# Patient Record
Sex: Male | Born: 1935 | Race: White | Hispanic: No | Marital: Married | Smoking: Former smoker
Health system: Southern US, Community
[De-identification: ages and names within clinical notes are randomized; demographics above are authoritative.]

## PROBLEM LIST (undated history)

## (undated) DIAGNOSIS — I10 Essential (primary) hypertension: Secondary | ICD-10-CM

## (undated) DIAGNOSIS — E7800 Pure hypercholesterolemia, unspecified: Secondary | ICD-10-CM

## (undated) DIAGNOSIS — Z85828 Personal history of other malignant neoplasm of skin: Secondary | ICD-10-CM

## (undated) DIAGNOSIS — N400 Enlarged prostate without lower urinary tract symptoms: Secondary | ICD-10-CM

## (undated) DIAGNOSIS — N189 Chronic kidney disease, unspecified: Secondary | ICD-10-CM

## (undated) DIAGNOSIS — J45909 Unspecified asthma, uncomplicated: Secondary | ICD-10-CM

## (undated) DIAGNOSIS — M109 Gout, unspecified: Secondary | ICD-10-CM

## (undated) HISTORY — PX: HERNIA REPAIR: SHX51

## (undated) HISTORY — DX: Personal history of other malignant neoplasm of skin: Z85.828

## (undated) HISTORY — DX: Chronic kidney disease, unspecified: N18.9

## (undated) HISTORY — DX: Benign prostatic hyperplasia without lower urinary tract symptoms: N40.0

## (undated) HISTORY — PX: CATARACT EXTRACTION, BILATERAL: SHX1313

---

## 2012-04-05 ENCOUNTER — Other Ambulatory Visit: Payer: Self-pay | Admitting: Internal Medicine

## 2012-04-05 DIAGNOSIS — N3090 Cystitis, unspecified without hematuria: Secondary | ICD-10-CM

## 2012-04-12 ENCOUNTER — Ambulatory Visit
Admission: RE | Admit: 2012-04-12 | Discharge: 2012-04-12 | Disposition: A | Discharge status: Home / Self Care | Payer: Medicare Other | Source: Ambulatory Visit | Attending: Internal Medicine | Admitting: Internal Medicine

## 2012-04-12 DIAGNOSIS — N3090 Cystitis, unspecified without hematuria: Secondary | ICD-10-CM

## 2013-10-05 ENCOUNTER — Emergency Department (HOSPITAL_COMMUNITY)
Admission: EM | Admit: 2013-10-05 | Discharge: 2013-10-05 | Disposition: A | Discharge status: Home / Self Care | Payer: Medicare Other | Attending: Emergency Medicine | Admitting: Emergency Medicine

## 2013-10-05 ENCOUNTER — Emergency Department (HOSPITAL_COMMUNITY): Payer: Medicare Other

## 2013-10-05 ENCOUNTER — Encounter (HOSPITAL_COMMUNITY): Payer: Self-pay | Admitting: Emergency Medicine

## 2013-10-05 DIAGNOSIS — Z7982 Long term (current) use of aspirin: Secondary | ICD-10-CM | POA: Insufficient documentation

## 2013-10-05 DIAGNOSIS — N289 Disorder of kidney and ureter, unspecified: Secondary | ICD-10-CM | POA: Insufficient documentation

## 2013-10-05 DIAGNOSIS — Z79899 Other long term (current) drug therapy: Secondary | ICD-10-CM | POA: Insufficient documentation

## 2013-10-05 DIAGNOSIS — J40 Bronchitis, not specified as acute or chronic: Secondary | ICD-10-CM | POA: Insufficient documentation

## 2013-10-05 DIAGNOSIS — Z87891 Personal history of nicotine dependence: Secondary | ICD-10-CM | POA: Insufficient documentation

## 2013-10-05 DIAGNOSIS — I10 Essential (primary) hypertension: Secondary | ICD-10-CM | POA: Insufficient documentation

## 2013-10-05 LAB — COMPREHENSIVE METABOLIC PANEL
ALBUMIN: 4.4 g/dL (ref 3.5–5.2)
ALT: 25 U/L (ref 0–53)
AST: 24 U/L (ref 0–37)
Alkaline Phosphatase: 77 U/L (ref 39–117)
BUN: 31 mg/dL — AB (ref 6–23)
CALCIUM: 10.7 mg/dL — AB (ref 8.4–10.5)
CO2: 22 mEq/L (ref 19–32)
Chloride: 103 mEq/L (ref 96–112)
Creatinine, Ser: 1.73 mg/dL — ABNORMAL HIGH (ref 0.50–1.35)
GFR calc non Af Amer: 35 mL/min — ABNORMAL LOW (ref >90)
GFR, EST AFRICAN AMERICAN: 40 mL/min — AB (ref >90)
GLUCOSE: 100 mg/dL — AB (ref 70–99)
Potassium: 5.1 mEq/L (ref 3.7–5.3)
Sodium: 138 mEq/L (ref 137–147)
TOTAL PROTEIN: 8.8 g/dL — AB (ref 6.0–8.3)
Total Bilirubin: 0.5 mg/dL (ref 0.3–1.2)

## 2013-10-05 LAB — CBC WITH DIFFERENTIAL/PLATELET
BASOS PCT: 1 % (ref 0–1)
Basophils Absolute: 0.1 10*3/uL (ref 0.0–0.1)
EOS ABS: 0.3 10*3/uL (ref 0.0–0.7)
EOS PCT: 4 % (ref 0–5)
HCT: 44.8 % (ref 39.0–52.0)
HEMOGLOBIN: 15.4 g/dL (ref 13.0–17.0)
LYMPHS ABS: 3.2 10*3/uL (ref 0.7–4.0)
Lymphocytes Relative: 36 % (ref 12–46)
MCH: 30.7 pg (ref 26.0–34.0)
MCHC: 34.4 g/dL (ref 30.0–36.0)
MCV: 89.2 fL (ref 78.0–100.0)
MONOS PCT: 7 % (ref 3–12)
Monocytes Absolute: 0.6 10*3/uL (ref 0.1–1.0)
NEUTROS PCT: 52 % (ref 43–77)
Neutro Abs: 4.7 10*3/uL (ref 1.7–7.7)
PLATELETS: 264 10*3/uL (ref 150–400)
RBC: 5.02 MIL/uL (ref 4.22–5.81)
RDW: 13.7 % (ref 11.5–15.5)
WBC: 8.9 10*3/uL (ref 4.0–10.5)

## 2013-10-05 LAB — I-STAT TROPONIN, ED: Troponin i, poc: 0 ng/mL (ref 0.00–0.08)

## 2013-10-05 MED ORDER — PREDNISONE 20 MG PO TABS: ORAL_TABLET | ORAL | Status: DC

## 2013-10-05 MED ORDER — METHYLPREDNISOLONE SODIUM SUCC 125 MG IJ SOLR
125.0000 mg | Freq: Once | INTRAMUSCULAR | Status: AC
  Administered 2013-10-05: 125 mg via INTRAVENOUS
  Filled 2013-10-05: qty 2

## 2013-10-05 MED ORDER — ALBUTEROL SULFATE HFA 108 (90 BASE) MCG/ACT IN AERS
2.0000 | INHALATION_SPRAY | Freq: Once | RESPIRATORY_TRACT | Status: AC
  Administered 2013-10-05: 2 via RESPIRATORY_TRACT
  Filled 2013-10-05: qty 6.7

## 2013-10-05 MED ORDER — SODIUM CHLORIDE 0.9 % IV BOLUS (SEPSIS)
1000.0000 mL | Freq: Once | INTRAVENOUS | Status: AC
  Administered 2013-10-05: 1000 mL via INTRAVENOUS

## 2013-10-05 MED ORDER — MORPHINE SULFATE 4 MG/ML IJ SOLN
4.0000 mg | Freq: Once | INTRAMUSCULAR | Status: DC
  Filled 2013-10-05: qty 1

## 2013-10-05 MED ORDER — ALBUTEROL SULFATE (2.5 MG/3ML) 0.083% IN NEBU
5.0000 mg | INHALATION_SOLUTION | Freq: Once | RESPIRATORY_TRACT | Status: AC
  Administered 2013-10-05: 5 mg via RESPIRATORY_TRACT
  Filled 2013-10-05: qty 6

## 2013-10-05 MED ORDER — IPRATROPIUM-ALBUTEROL 0.5-2.5 (3) MG/3ML IN SOLN
3.0000 mL | Freq: Once | RESPIRATORY_TRACT | Status: AC
  Administered 2013-10-05: 3 mL via RESPIRATORY_TRACT
  Filled 2013-10-05: qty 3

## 2013-10-05 NOTE — ED Notes (Signed)
Pt reports left sided CP x 1 week, worse with cough. States productive cough with green sputum x 1 week. Seen by PCP 4/17 for same. Pt denies pain at this time. Denies SOB, N/V, diaphoresis. NAD.

## 2013-10-05 NOTE — Discharge Instructions (Signed)
Take prednisone as prescribed.   Use albuterol every 4 hrs for 2 days then as needed.   Follow up with your doctor in a week. Your oxygen is running low. You need recheck with your doctor. Your kidney function is slightly off, need recheck.   Return to ER if you have worse chest pain, congestion, shortness of breath.

## 2013-10-05 NOTE — ED Notes (Signed)
Patient ambulated in hallways- pt taking deep breaths SpO2 93-95% pt denies SOB, dizziness and lightheadedness. Pt instructed to breathe normally pt sats maintained over 90% for most of the time.

## 2013-10-05 NOTE — ED Notes (Signed)
Patient ambulated in hallways by Marylu LundJanet EMT without O2- pt denies SOB sts "I feel fine" O2 sats between 89-94%. Dr. Silverio LayYao made aware.

## 2013-10-05 NOTE — ED Provider Notes (Addendum)
CSN: [id number]     Arrival date & time 10/05/13  1116 History   First MD Initiated Contact with Patient 10/05/13 1129     Chief Complaint  Patient presents with  . Cough  . Chest Pain     (Consider location/radiation/quality/duration/timing/severity/associated sxs/prior Treatment) The history is provided by the patient.  Benjamin Sanders is a 80 y.o. male hx of HTN, here with cough, chest pain. Productive cough with greenish sputum for the last several days. Also intermittent substernal chest pain when he coughs. Denies fevers or chills. Denies shortness of breath. Went to see his doctor 5 days ago and was thought to have viral syndrome and was given pneumonia vaccine. He has been coughing more since then and came in for evaluation.    History reviewed. No pertinent past medical history. Past Surgical History  Procedure Laterality Date  . Hernia repair     History reviewed. No pertinent family history. History  Substance Use Topics  . Smoking status: Former Smoker    Types: Cigarettes    Quit date: 06/16/1967  . Smokeless tobacco: Not on file  . Alcohol Use: No    Review of Systems  Respiratory: Positive for cough.   Cardiovascular: Positive for chest pain.  All other systems reviewed and are negative.     Allergies  Review of patient's allergies indicates no known allergies.  Home Medications   Prior to Admission medications   Medication Sig Start Date End Date Taking? Authorizing Provider  acetaminophen (TYLENOL) 500 MG tablet Take 1,000 mg by mouth every 6 (six) hours as needed for mild pain.   Yes Historical Provider, MD  allopurinol (ZYLOPRIM) 100 MG tablet Take 100 mg by mouth daily.   Yes Historical Provider, MD  amLODipine (NORVASC) 10 MG tablet Take 10 mg by mouth daily.   Yes Historical Provider, MD  aspirin EC 81 MG tablet Take 81 mg by mouth daily.   Yes Historical Provider, MD  febuxostat (ULORIC) 40 MG tablet Take 40 mg by mouth daily.   Yes Historical  Provider, MD  hydrochlorothiazide (MICROZIDE) 12.5 MG capsule Take 12.5 mg by mouth daily.   Yes Historical Provider, MD  lisinopril (PRINIVIL,ZESTRIL) 20 MG tablet Take 20 mg by mouth daily.   Yes Historical Provider, MD  rosuvastatin (CRESTOR) 20 MG tablet Take 20 mg by mouth at bedtime.   Yes Historical Provider, MD   BP 125/56  Pulse 67  Temp(Src) 97.9 F (36.6 C) (Oral)  Resp 18  Ht 5\' 8"  (1.727 m)  SpO2 98% Physical Exam  Nursing note and vitals reviewed. Constitutional: He is oriented to person, place, and time.  Chronically ill, coughing   HENT:  Head: Normocephalic.  Mouth/Throat: Oropharynx is clear and moist.  Eyes: Conjunctivae and EOM are normal. Pupils are equal, round, and reactive to light.  Neck: Normal range of motion. Neck supple.  Cardiovascular: Normal rate, regular rhythm and normal heart sounds.   Pulmonary/Chest:  Coughing, diminished breath sounds throughout. No obvious crackles or wheezing. Mild reproducible substernal tenderness   Abdominal: Soft. Bowel sounds are normal. He exhibits no distension. There is no tenderness. There is no rebound and no guarding.  Musculoskeletal: Normal range of motion. He exhibits no edema and no tenderness.  Neurological: He is alert and oriented to person, place, and time.  Skin: Skin is warm and dry.  Psychiatric: He has a normal mood and affect. His behavior is normal. Judgment and thought content normal.    ED Course  Procedures (  including critical care time) Labs Review Labs Reviewed  COMPREHENSIVE METABOLIC PANEL - Abnormal; Notable for the following:    Glucose, Bld 100 (*)    BUN 31 (*)    Creatinine, Ser 1.73 (*)    Calcium 10.7 (*)    Total Protein 8.8 (*)    GFR calc non Af Amer 35 (*)    GFR calc Af Amer 40 (*)    All other components within normal limits  CBC WITH DIFFERENTIAL  Rosezena SensorI-STAT TROPOININ, ED    Imaging Review Dg Chest 2 View  10/05/2013   CLINICAL DATA:  Cough and wheezing.  EXAM: CHEST  2  VIEW  COMPARISON:  None.  FINDINGS: Cardiac silhouette is normal in size. Normal mediastinal and hilar contours.  There is linear opacity at the lung bases. This may be atelectasis or scarring or a combination. Lungs are otherwise clear. No pleural effusion. No pneumothorax.  Bony thorax is demineralized grossly intact.  IMPRESSION: No acute cardiopulmonary disease.   Electronically Signed   By: Amie Portlandavid  Ormond M.D.   On: 10/05/2013 12:28     EKG Interpretation   Date/Time:  Thursday October 05 2013 11:20:17 EDT Ventricular Rate:  78 PR Interval:  174 QRS Duration: 96 QT Interval:  360 QTC Calculation: 410 R Axis:   -53 Text Interpretation:  Normal sinus rhythm Left anterior fascicular block  Septal infarct , age undetermined Abnormal ECG No previous ECGs available  Confirmed by YAO  MD, DAVID ([id number]) on 10/05/2013 11:39:55 AM Also  confirmed by Silverio LayYAO  MD, DAVID ([id number])  on 10/05/2013 1:16:51 PM      MDM   Final diagnoses:  None    Benjamin Sanders is a 80 y.o. male here with cough, chest pain. Likely bronchitis vs pneumonia. I doubt acs or dissection or PE. Will get labs, CXR. Will give nebs empirically.   4:09 PM cxr showed no pneumonia. Was hypoxic on ambulation initially. After 3 nebs, solumedrol, no longer hypoxic on ambulation. Maintains O2 sat > 90%. Cr 1.7, no baseline. Given IVF. I doubt PE as his clinical picture consistent with bronchitis. Offered admission for observation, but patient declined.    Richardean Canalavid H Yao, MD 10/05/13 1610  Richardean Canalavid H Yao, MD 10/05/13 1610  Richardean Canalavid H Yao, MD 10/05/13 [phone]

## 2013-10-05 NOTE — ED Notes (Signed)
Dr. Yao at bedside. 

## 2013-10-05 NOTE — ED Notes (Signed)
Pt SpO2 between 88-89% on RA. RN instructed patient on deep breathing techniques for 1 min before SpO2 increased to appx. 98%.

## 2015-05-11 IMAGING — CR DG CHEST 2V
2 series · 2 of 2 positions shown · non-contrast
Comparison: None.

CLINICAL DATA: Cough and wheezing.

EXAM:
CHEST  2 VIEW

[w chest pa]
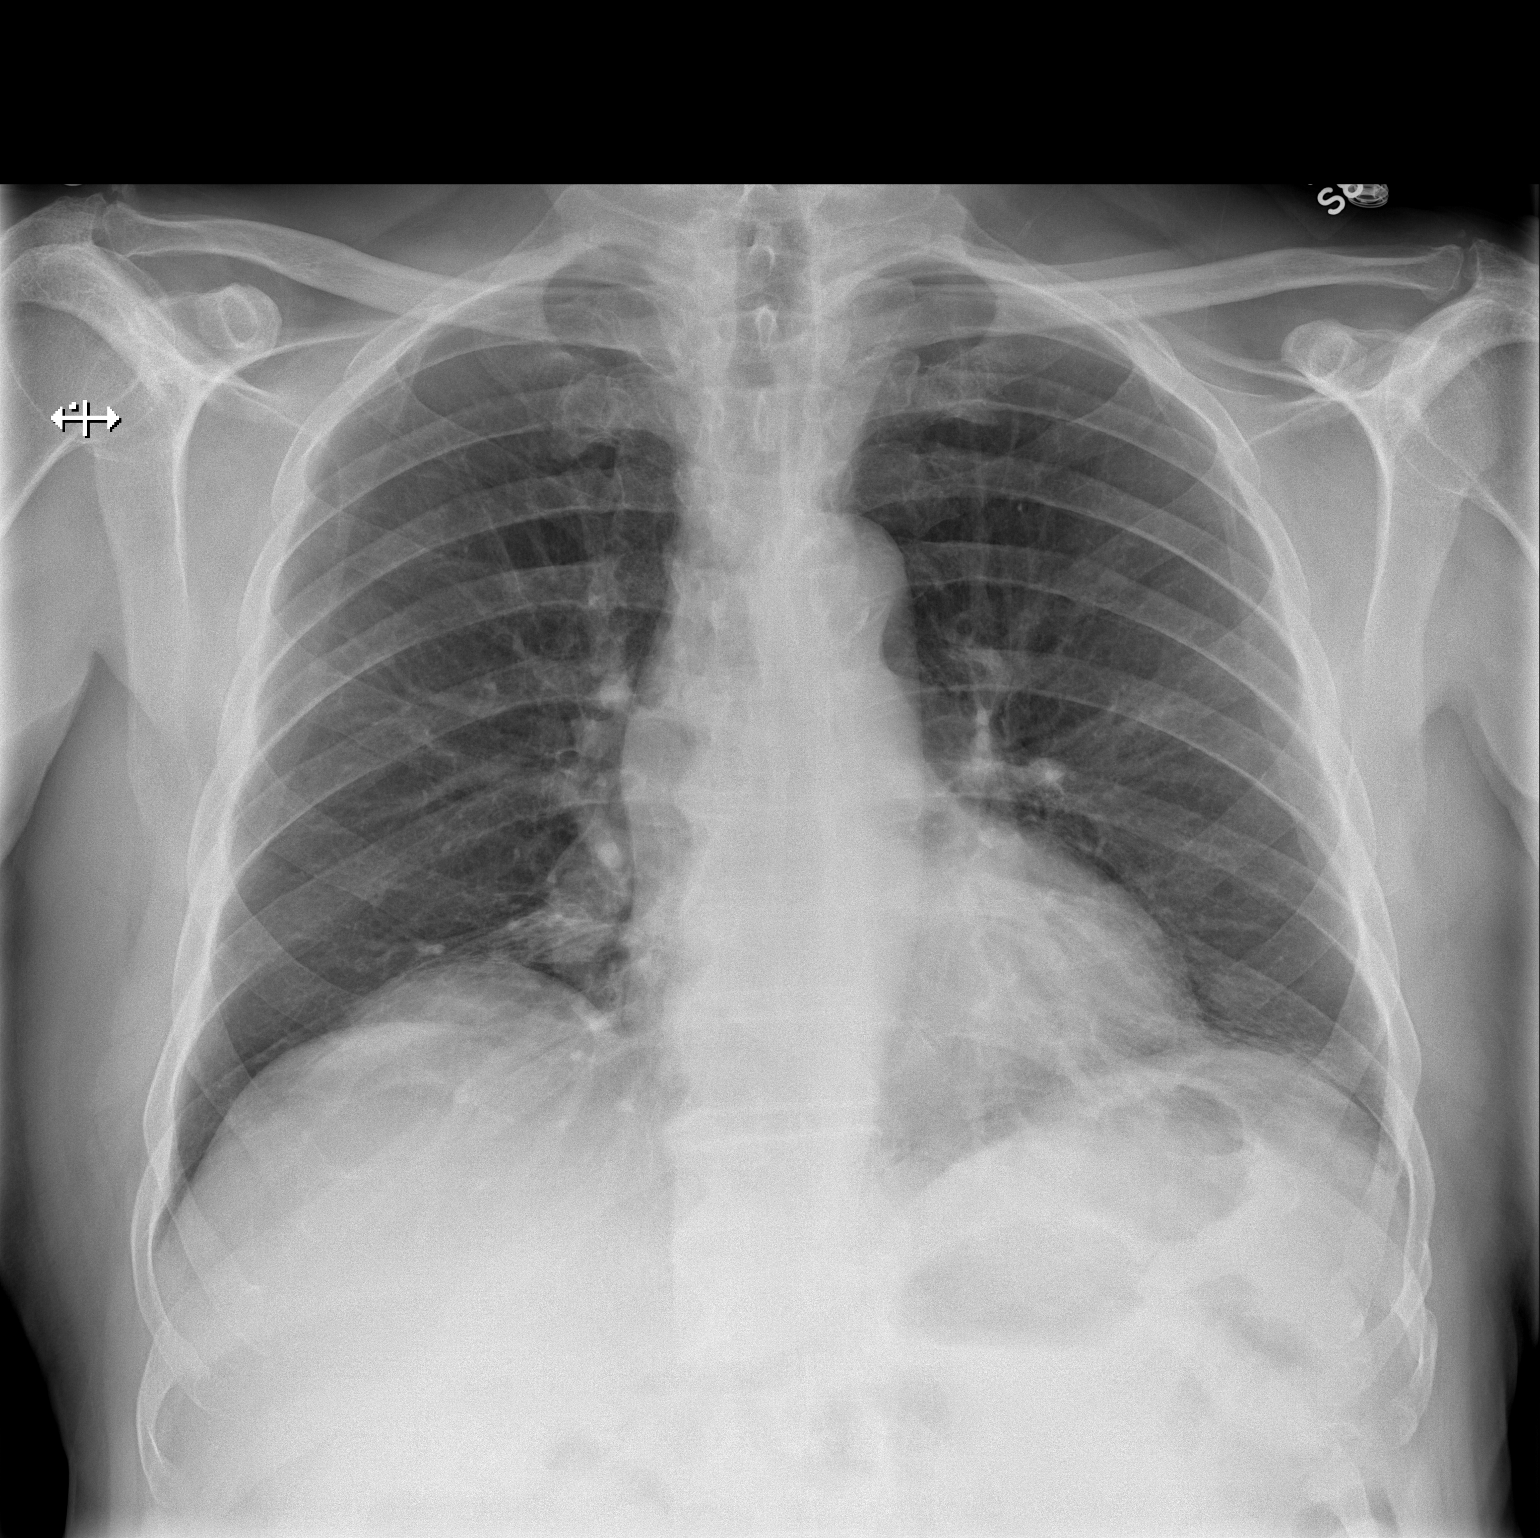

[w chest lat]
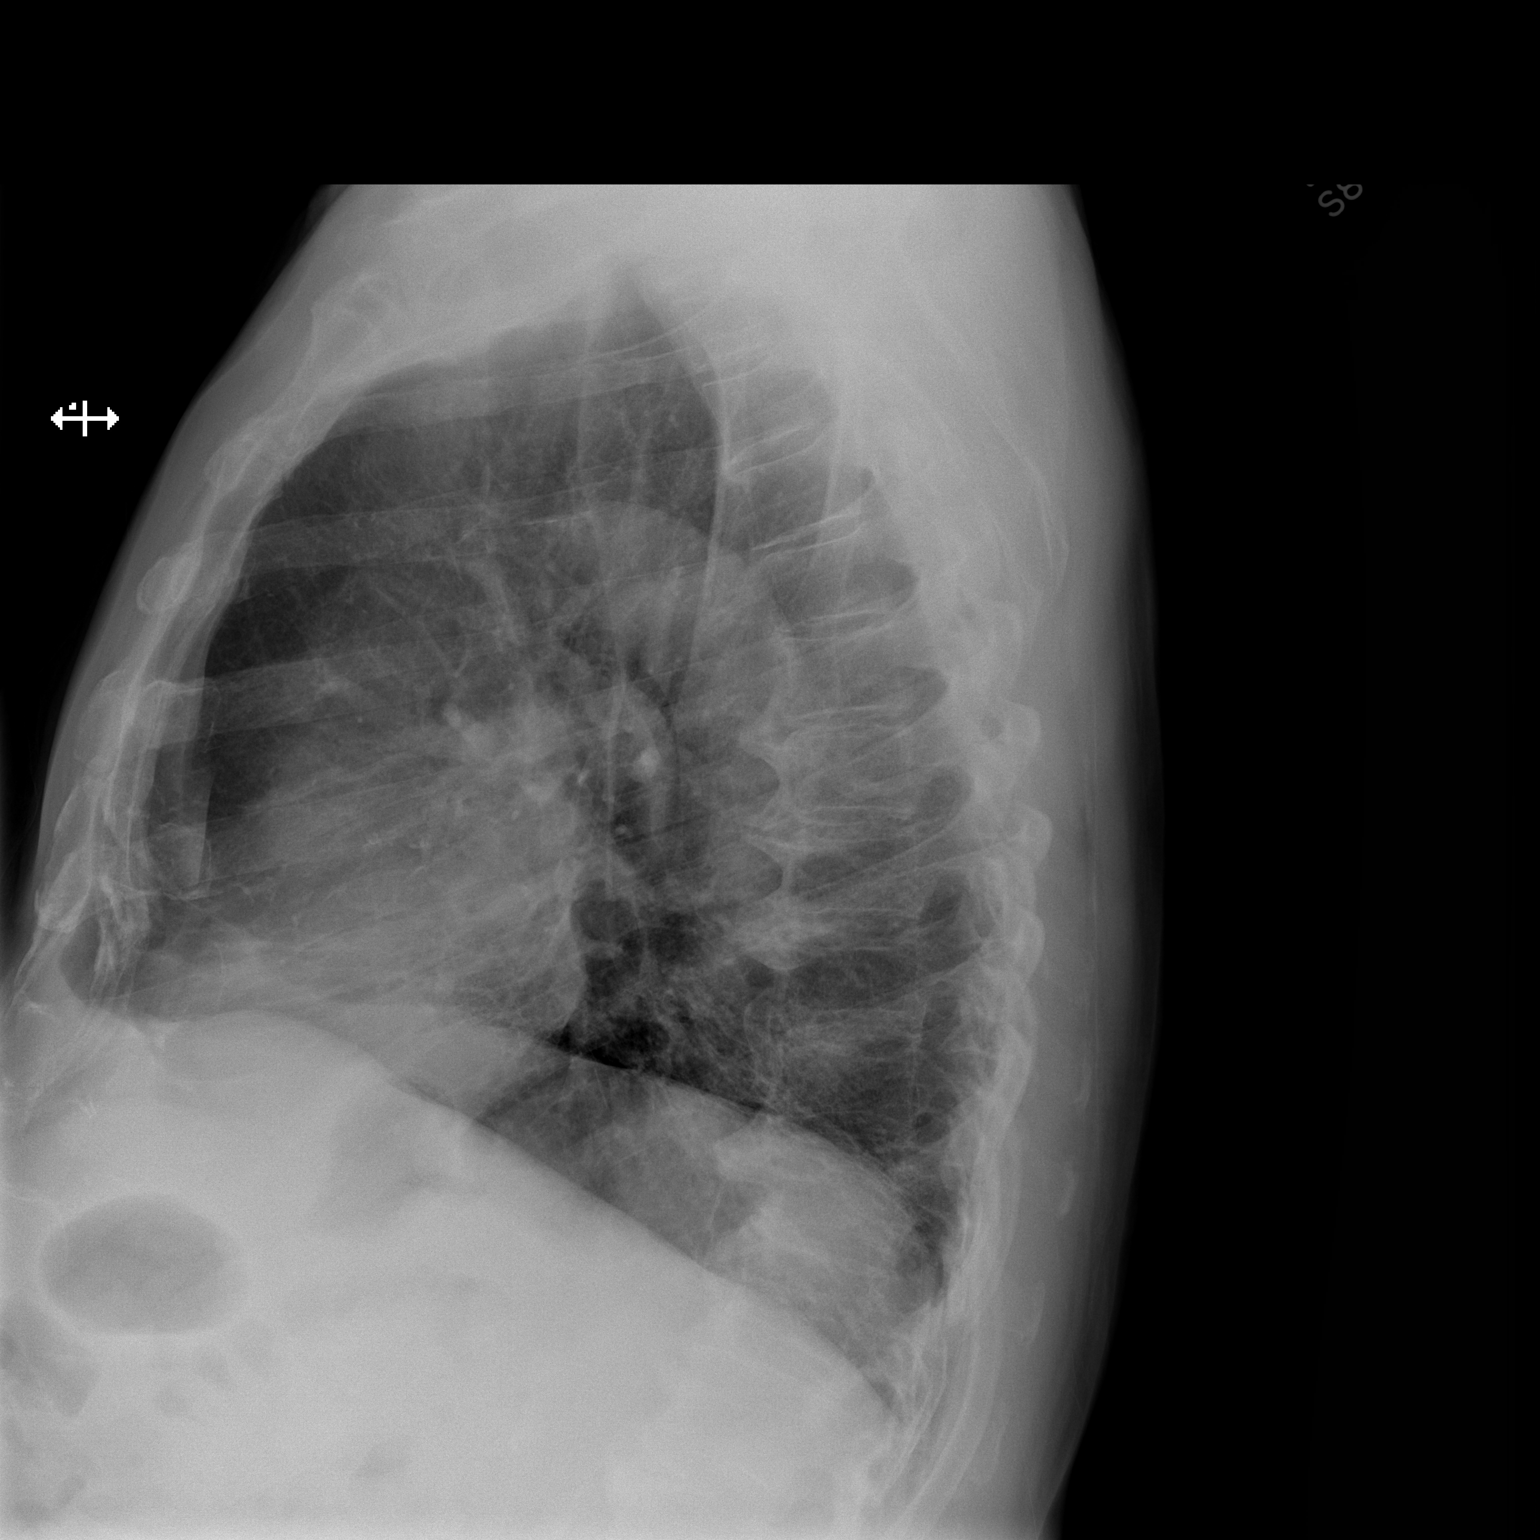

[2 of 2 positions shown; findings below may reference images not displayed]

FINDINGS: Cardiac silhouette is normal in size. Normal mediastinal and hilar
contours.

There is linear opacity at the lung bases. This may be atelectasis
or scarring or a combination. Lungs are otherwise clear. No pleural
effusion. No pneumothorax.

Bony thorax is demineralized grossly intact.
IMPRESSION: No acute cardiopulmonary disease.

## 2015-06-20 ENCOUNTER — Encounter (HOSPITAL_COMMUNITY): Payer: Self-pay | Admitting: *Deleted

## 2015-06-20 ENCOUNTER — Inpatient Hospital Stay (HOSPITAL_COMMUNITY)
Admission: EM | Admit: 2015-06-20 | Discharge: 2015-06-26 | DRG: 871 | Disposition: A | Discharge status: Home / Self Care | Payer: Medicare Other | Attending: Internal Medicine | Admitting: Internal Medicine

## 2015-06-20 ENCOUNTER — Emergency Department (HOSPITAL_COMMUNITY): Payer: Medicare Other

## 2015-06-20 DIAGNOSIS — R042 Hemoptysis: Secondary | ICD-10-CM | POA: Diagnosis present

## 2015-06-20 DIAGNOSIS — Z823 Family history of stroke: Secondary | ICD-10-CM | POA: Diagnosis not present

## 2015-06-20 DIAGNOSIS — R6520 Severe sepsis without septic shock: Secondary | ICD-10-CM | POA: Diagnosis present

## 2015-06-20 DIAGNOSIS — M1A00X0 Idiopathic chronic gout, unspecified site, without tophus (tophi): Secondary | ICD-10-CM | POA: Diagnosis not present

## 2015-06-20 DIAGNOSIS — Z8249 Family history of ischemic heart disease and other diseases of the circulatory system: Secondary | ICD-10-CM | POA: Diagnosis not present

## 2015-06-20 DIAGNOSIS — Z6829 Body mass index (BMI) 29.0-29.9, adult: Secondary | ICD-10-CM | POA: Diagnosis not present

## 2015-06-20 DIAGNOSIS — Z87891 Personal history of nicotine dependence: Secondary | ICD-10-CM | POA: Diagnosis not present

## 2015-06-20 DIAGNOSIS — J189 Pneumonia, unspecified organism: Secondary | ICD-10-CM | POA: Diagnosis present

## 2015-06-20 DIAGNOSIS — E7800 Pure hypercholesterolemia, unspecified: Secondary | ICD-10-CM | POA: Diagnosis present

## 2015-06-20 DIAGNOSIS — I10 Essential (primary) hypertension: Secondary | ICD-10-CM | POA: Diagnosis not present

## 2015-06-20 DIAGNOSIS — R0902 Hypoxemia: Secondary | ICD-10-CM | POA: Diagnosis present

## 2015-06-20 DIAGNOSIS — Z66 Do not resuscitate: Secondary | ICD-10-CM | POA: Diagnosis present

## 2015-06-20 DIAGNOSIS — Z7982 Long term (current) use of aspirin: Secondary | ICD-10-CM

## 2015-06-20 DIAGNOSIS — N183 Chronic kidney disease, stage 3 unspecified: Secondary | ICD-10-CM | POA: Diagnosis present

## 2015-06-20 DIAGNOSIS — A419 Sepsis, unspecified organism: Secondary | ICD-10-CM | POA: Diagnosis present

## 2015-06-20 DIAGNOSIS — I444 Left anterior fascicular block: Secondary | ICD-10-CM | POA: Diagnosis present

## 2015-06-20 DIAGNOSIS — I129 Hypertensive chronic kidney disease with stage 1 through stage 4 chronic kidney disease, or unspecified chronic kidney disease: Secondary | ICD-10-CM | POA: Diagnosis present

## 2015-06-20 DIAGNOSIS — M109 Gout, unspecified: Secondary | ICD-10-CM | POA: Diagnosis present

## 2015-06-20 DIAGNOSIS — Z806 Family history of leukemia: Secondary | ICD-10-CM

## 2015-06-20 DIAGNOSIS — R600 Localized edema: Secondary | ICD-10-CM | POA: Diagnosis present

## 2015-06-20 DIAGNOSIS — J4520 Mild intermittent asthma, uncomplicated: Secondary | ICD-10-CM | POA: Diagnosis present

## 2015-06-20 DIAGNOSIS — J13 Pneumonia due to Streptococcus pneumoniae: Secondary | ICD-10-CM | POA: Diagnosis present

## 2015-06-20 DIAGNOSIS — R0602 Shortness of breath: Secondary | ICD-10-CM | POA: Diagnosis not present

## 2015-06-20 HISTORY — DX: Unspecified asthma, uncomplicated: J45.909

## 2015-06-20 HISTORY — DX: Essential (primary) hypertension: I10

## 2015-06-20 HISTORY — DX: Pure hypercholesterolemia, unspecified: E78.00

## 2015-06-20 HISTORY — DX: Gout, unspecified: M10.9

## 2015-06-20 LAB — URINALYSIS, ROUTINE W REFLEX MICROSCOPIC
BILIRUBIN URINE: NEGATIVE
Glucose, UA: NEGATIVE mg/dL
Hgb urine dipstick: NEGATIVE
KETONES UR: NEGATIVE mg/dL
Leukocytes, UA: NEGATIVE
NITRITE: NEGATIVE
PH: 5 (ref 5.0–8.0)
Protein, ur: 100 mg/dL — AB
Specific Gravity, Urine: 1.019 (ref 1.005–1.030)

## 2015-06-20 LAB — COMPREHENSIVE METABOLIC PANEL
ALT: 21 U/L (ref 17–63)
ANION GAP: 13 (ref 5–15)
AST: 23 U/L (ref 15–41)
Albumin: 3.6 g/dL (ref 3.5–5.0)
Alkaline Phosphatase: 75 U/L (ref 38–126)
BUN: 34 mg/dL — ABNORMAL HIGH (ref 6–20)
CALCIUM: 9.8 mg/dL (ref 8.9–10.3)
CHLORIDE: 102 mmol/L (ref 101–111)
CO2: 21 mmol/L — ABNORMAL LOW (ref 22–32)
CREATININE: 2.06 mg/dL — AB (ref 0.61–1.24)
GFR, EST AFRICAN AMERICAN: 32 mL/min — AB (ref >60)
GFR, EST NON AFRICAN AMERICAN: 28 mL/min — AB (ref >60)
Glucose, Bld: 157 mg/dL — ABNORMAL HIGH (ref 65–99)
Potassium: 3.8 mmol/L (ref 3.5–5.1)
Sodium: 136 mmol/L (ref 135–145)
Total Bilirubin: 0.7 mg/dL (ref 0.3–1.2)
Total Protein: 7.2 g/dL (ref 6.5–8.1)

## 2015-06-20 LAB — CBC WITH DIFFERENTIAL/PLATELET
BASOS PCT: 0 %
Basophils Absolute: 0 10*3/uL (ref 0.0–0.1)
EOS ABS: 0 10*3/uL (ref 0.0–0.7)
Eosinophils Relative: 0 %
HCT: 43.4 % (ref 39.0–52.0)
Hemoglobin: 14.7 g/dL (ref 13.0–17.0)
Lymphocytes Relative: 4 %
Lymphs Abs: 0.7 10*3/uL (ref 0.7–4.0)
MCH: 29.5 pg (ref 26.0–34.0)
MCHC: 33.9 g/dL (ref 30.0–36.0)
MCV: 87 fL (ref 78.0–100.0)
Monocytes Absolute: 1 10*3/uL (ref 0.1–1.0)
Monocytes Relative: 5 %
NEUTROS PCT: 91 %
Neutro Abs: 16.2 10*3/uL — ABNORMAL HIGH (ref 1.7–7.7)
PLATELETS: 253 10*3/uL (ref 150–400)
RBC: 4.99 MIL/uL (ref 4.22–5.81)
RDW: 13.6 % (ref 11.5–15.5)
WBC: 17.9 10*3/uL — ABNORMAL HIGH (ref 4.0–10.5)

## 2015-06-20 LAB — I-STAT CG4 LACTIC ACID, ED
LACTIC ACID, VENOUS: 2.01 mmol/L — AB (ref 0.5–2.0)
Lactic Acid, Venous: 3.01 mmol/L (ref 0.5–2.0)

## 2015-06-20 LAB — URINE MICROSCOPIC-ADD ON
RBC / HPF: NONE SEEN RBC/hpf (ref 0–5)
WBC UA: NONE SEEN WBC/hpf (ref 0–5)

## 2015-06-20 LAB — MRSA PCR SCREENING: MRSA BY PCR: NEGATIVE

## 2015-06-20 LAB — STREP PNEUMONIAE URINARY ANTIGEN: Strep Pneumo Urinary Antigen: POSITIVE — AB

## 2015-06-20 LAB — LACTIC ACID, PLASMA: LACTIC ACID, VENOUS: 1.7 mmol/L (ref 0.5–2.0)

## 2015-06-20 MED ORDER — ACETAMINOPHEN 325 MG PO TABS: 650.0000 mg | ORAL_TABLET | Freq: Four times a day (QID) | ORAL | Status: DC | PRN

## 2015-06-20 MED ORDER — FEBUXOSTAT 40 MG PO TABS: 40.0000 mg | ORAL_TABLET | Freq: Every day | ORAL | Status: DC

## 2015-06-20 MED ORDER — ACETAMINOPHEN 650 MG RE SUPP: 650.0000 mg | Freq: Four times a day (QID) | RECTAL | Status: DC | PRN

## 2015-06-20 MED ORDER — DEXTROSE 5 % IV SOLN
500.0000 mg | INTRAVENOUS | Status: DC
  Administered 2015-06-20 – 2015-06-23 (×4): 500 mg via INTRAVENOUS
  Filled 2015-06-20 (×5): qty 500

## 2015-06-20 MED ORDER — ROSUVASTATIN CALCIUM 10 MG PO TABS: 20.0000 mg | ORAL_TABLET | Freq: Every day | ORAL | Status: DC

## 2015-06-20 MED ORDER — CEFTRIAXONE SODIUM 1 G IJ SOLR: 1.0000 g | Freq: Once | INTRAMUSCULAR | Status: DC

## 2015-06-20 MED ORDER — ENOXAPARIN SODIUM 30 MG/0.3ML ~~LOC~~ SOLN
30.0000 mg | SUBCUTANEOUS | Status: DC
Start: 2015-06-20 — End: 2015-06-21
  Administered 2015-06-20: 30 mg via SUBCUTANEOUS
  Filled 2015-06-20: qty 0.3

## 2015-06-20 MED ORDER — ALLOPURINOL 100 MG PO TABS
100.0000 mg | ORAL_TABLET | Freq: Every day | ORAL | Status: DC
Start: 2015-06-21 — End: 2015-06-24
  Administered 2015-06-21 – 2015-06-23 (×3): 100 mg via ORAL
  Filled 2015-06-20 (×3): qty 1

## 2015-06-20 MED ORDER — ASPIRIN EC 81 MG PO TBEC
81.0000 mg | DELAYED_RELEASE_TABLET | Freq: Every day | ORAL | Status: DC
  Administered 2015-06-21 – 2015-06-26 (×6): 81 mg via ORAL
  Filled 2015-06-20 (×6): qty 1

## 2015-06-20 MED ORDER — SODIUM CHLORIDE 0.9 % IJ SOLN
3.0000 mL | Freq: Two times a day (BID) | INTRAMUSCULAR | Status: DC
  Administered 2015-06-20 – 2015-06-26 (×11): 3 mL via INTRAVENOUS

## 2015-06-20 MED ORDER — DEXTROSE 5 % IV SOLN
1.0000 g | INTRAVENOUS | Status: DC
  Administered 2015-06-20 – 2015-06-23 (×4): 1 g via INTRAVENOUS
  Filled 2015-06-20 (×5): qty 10

## 2015-06-20 MED ORDER — SODIUM CHLORIDE 0.9 % IV BOLUS (SEPSIS)
1000.0000 mL | INTRAVENOUS | Status: AC
  Administered 2015-06-20 (×3): 1000 mL via INTRAVENOUS

## 2015-06-20 MED ORDER — SODIUM CHLORIDE 0.9 % IV SOLN
INTRAVENOUS | Status: DC
  Administered 2015-06-20 – 2015-06-23 (×6): via INTRAVENOUS

## 2015-06-20 MED ORDER — DEXTROSE 5 % IV SOLN: 500.0000 mg | Freq: Once | INTRAVENOUS | Status: DC

## 2015-06-20 MED ORDER — CETYLPYRIDINIUM CHLORIDE 0.05 % MT LIQD
7.0000 mL | Freq: Two times a day (BID) | OROMUCOSAL | Status: DC
  Administered 2015-06-20 – 2015-06-26 (×11): 7 mL via OROMUCOSAL

## 2015-06-20 MED ORDER — ALBUTEROL SULFATE (2.5 MG/3ML) 0.083% IN NEBU
2.5000 mg | INHALATION_SOLUTION | RESPIRATORY_TRACT | Status: DC | PRN
  Administered 2015-06-20 – 2015-06-21 (×3): 2.5 mg via RESPIRATORY_TRACT
  Filled 2015-06-20 (×3): qty 3

## 2015-06-20 NOTE — ED Notes (Signed)
Pt O2 sats dropped to 88% on 6 L O2. Breathing treatment administered. O2 sats improved to 92%

## 2015-06-20 NOTE — H&P (Signed)
History and Physical  Patient Name: Benjamin Sanders     ZOX:[id number]    DOB: 04/17/1928    DOA: 06/20/2015 Referring physician: Napoleon FormAngela Bacigaluipo, MD PCP: Ezequiel KayserPERINI,MARK A, MD      Chief Complaint: Cough, hemoptysis  HPI: Benjamin DoomsWilliam Sanders is a 82 y.o. male with a past medical history significant for HTN who presents with acute cough and hemopytsis.  The patient was in his normal state of health until about 3 days ago when he developed productive cough and fever. The cough progressed over the following 3 days until today he was coughing up small amounts of blood, still had fever, and was working harder to breathe, so his wife brought him to the ER.  In the ED, he was febrile to 101.82F, tachycardic, tachypneic, and hypoxic to 88% on room air. He had leukocytosis and an elevated lactate. The chest x-ray showed a left base opacity. Code sepsis was called, blood cultures and urine culture was obtained, a 30 mL/kg dose was administered as well as ceftriaxone and azithromycin. TRH were asked to admit for sepsis and CPAP.     Review of Systems:  Pt complains of cough, fever, hemoptysis. All other systems negative except as just noted or noted in the history of present illness.   Allergies: No Known Allergies  Home medications: 1. Amlodipine 10 mg daily 2. HCTZ 12.5 mg daily 3. Rosuvastatin 20 mg daily 4. Aspirin 81 mg daily 5. Febuxostat 40 mg daily 6. Allopurinol 100 mg daily 7. Carvedilol  Past medical history: 1. Mild intermittent asthma, uses albuterol only in the spring 2. Essential hypertension 3. Gout 4. CKD, probably, previous Cr several years ago with asthma exacerbation was 1.75 mg/dL  Past surgical history: 1. Hernia repair  Family history:  Mother, stroke. Father, heart attack. Sister, cancer, unknown type. 3 times. Other, leukemia.   Social History:  Patient lives with his wife. [place]. 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 He still drives. He walks without an assistive device. He smoked and quit in 1968. He does not drink alcohol.  He does not have an HCP light.      Physical Exam: BP 134/72 mmHg  Pulse 84  Temp(Src) 99.1 F (37.3 C) (Oral)  Resp 24  Ht 5\' 9"  (1.753 m)  Wt 91.173 kg (201 lb)  BMI 29.67 kg/m2  SpO2 93% General appearance: Well-developed, adult male, alert and in moderate distress from dyspnea.   Eyes: Anicteric, conjunctiva pink but injected, lids and lashes normal.     ENT: No nasal deformity, discharge, or epistaxis.  OP moist without lesions.  Crusted blood around mouth. Lymph: No cervical, supraclavicular lymphadenopathy. Skin: Warm and moist.  Cap refill normal. Cardiac: RRR, nl S1-S2, possible SEM faint, obscured by coarse breath sounds.  JVP normal.  Trace LE edema.  Radial pulses 2+ and symmetric. Respiratory: Increase WOB.  Bronchial breath sounds, worse on L.   Abdomen: Abdomen soft without rigidity.  No TTP.  MSK: No deformities or effusions. Neuro: Oriented to person place and time.  And situation.  Sensorium intact and responding to questions, attention normal.  Speech is fluent.  Moves all extremities equally and with normal coordination.    Psych: Behavior appropriate.  Affect normal.  No evidence of aural or visual hallucinations or delusions.       Labs on Admission:  The metabolic panel shows normal electrolytes.  The Cr is 2.06 mg/dL, a previous several years ago was 1.75 mg/dL Transaminases and bilirubin normal. Lactic acid > 3 mmol/L. The complete blood count shows leukocytosis without anemia or thrombocytopenia.   Radiological Exams on Admission: Personally reviewed: Dg Chest Portable 1 View 06/20/2015  Left base opacity   EKG: Independently reviewed. Sinus, rate 91.  QTc 431.  LAFB without STTW changes.    Assessment/Plan 1. Sepsis from CAP:  This is new.   Suspected source pneumonia. Organism unknown. Patient meets criteria given tachycardia, tachypnea, fever, leukocytosis, and evidence of organ dysfunction (lactate and hypoxia).  Blood and urine cultures drawn.  Lactate exceeds 2 mmol/L and repeat ordered within 6 hours.  MAP > 65 mmHg. -Ceftriaxone and azithromycin, dosing per pharmacy -30 ml/kg bolus given in ED, will repeat lactic acid -Admit to stepdown -Strep and legionella urine antigens are ordered (hemoptysis and age>50) -Sputum culture and gram stain -IS and albuterol PRN -Follow blood culture -PT/OT eval when improving    2. HTN:  Normotensive at admission -Hold carvedilol, amlodipine, and HCTZ until sepsis clearly resolving  3. Gout:  Patient on both febuxostat and allopurinol  4. CKD vs AKI:  Suspect the patient's basline creatinine is around 2 mg/dL and that this is not AKI.  His lisinopril was stopped some time ago he said, by Dr. Waynard Edwards, I presume for worsening creatinine -Repeat BMP ordered    DVT PPx: Lovenox Diet: Heart healthy Consultants: None Code Status: DO NOT RESUSCITATE Family Communication: Wife, present at bedside. Diagnosis discussed. Overnight treatment plan discussed. All questions answered. CODE STATUS confirmed in presence of wife.   Medical decision making: What exists of the patient's previous chart was reviewed in depth and the case was discussed with Dr. Beryle Flock. Patient seen 7:43 PM on 06/20/2015.  Disposition Plan:  Admit to stepdown for sepsis with CAP.  Anticipate stabilization and transfer to medical floor within 2 days.  Given age and CKD, prognosis is somewhat guarded.      Alberteen Sam [facility] Pager [phone]

## 2015-06-20 NOTE — Progress Notes (Addendum)
ANTIBIOTIC CONSULT NOTE - INITIAL  Pharmacy Consult for azithromycin and ceftriaxone Indication: rule out sepsis  No Known Allergies  Patient Measurements: Height: 5\' 9"  (175.3 cm) Weight: 201 lb (91.173 kg) IBW/kg (Calculated) : 70.7 Adjusted Body Weight:   Vital Signs: Temp: 99.1 F (37.3 C) (01/05 1909) Temp Source: Oral (01/05 1909) BP: 135/81 mmHg (01/05 2015) Pulse Rate: 87 (01/05 2028) Intake/Output from previous day:   Intake/Output from this shift: Total I/O In: 3000 [I.V.:3000] Out: -   Labs:  Recent Labs  06/20/15 1755 06/20/15 1757  WBC 17.9*  --   HGB 14.7  --   PLT 253  --   CREATININE  --  2.06*   Estimated Creatinine Clearance: 29.3 mL/min (by C-G formula based on Cr of 2.06). No results for input(s): VANCOTROUGH, VANCOPEAK, VANCORANDOM, GENTTROUGH, GENTPEAK, GENTRANDOM, TOBRATROUGH, TOBRAPEAK, TOBRARND, AMIKACINPEAK, AMIKACINTROU, AMIKACIN in the last 72 hours.   Microbiology: Recent Results (from the past 720 hour(s))  Culture, blood (routine x 2)     Status: None (Preliminary result)   Collection Time: 06/20/15  6:10 PM  Result Value Ref Range Status   Specimen Description BLOOD RIGHT ANTECUBITAL  Final   Special Requests BOTTLES DRAWN AEROBIC AND ANAEROBIC 5CC  Final   Culture PENDING  Incomplete   Report Status PENDING  Incomplete    Medical History: Past Medical History  Diagnosis Date  . Asthma   . Hypertension   . Gout   . Hypercholesteremia    Assessment: 82 y.o. male with a past medical history significant for HTN who presents with acute cough and hemopytsis.  Patient started on azith and ceftriaxone for r/o sepsis, first doses already given.  Tmax 101.3, wbc 17.9, scr elevated at 2.06.  No dose adjustments warranted at this time, pharmacy to sign off and follow peripherally.  Goal of Therapy:  Eradication of infection  Plan:  Continue azith 500mg  IV daily - change to po when able Ceftriaxone 1g q24 hours Follow up cx  data and length of therapy De-escalate abx when able  Sheppard CoilFrank Wilson PharmD., BCPS Clinical Pharmacist Pager [phone] 06/20/2015 9:12 PM    ========================   Addendum: - add vancomycin for PNA   Goal of Therapy: Vanc trough 15-20 mcg/mL   Plan: - Vanc 1750mg  IV x 1, then 1250mg  IV Q24H - Monitor renal fxn, clinical progress, vanc trough as indicated    Lum Stillinger D. Laney Potashang, PharmD, BCPS Pager:  [phone] 06/21/2015, 9:15 AM

## 2015-06-20 NOTE — ED Notes (Signed)
Pt states sob and cough since yesterday and hemoptysis since today.  Called his pcp who stated to come here.  O2 sats of 88% on RA.  Increased to 90% on 4L in triage.  Denies pain.  LE edema noted, but pt states this is "d/t his medicine".

## 2015-06-20 NOTE — ED Provider Notes (Signed)
CSN: [id number]     Arrival date & time 06/20/15  1645 History   First MD Initiated Contact with Patient 06/20/15 1730     Chief Complaint  Patient presents with  . Shortness of Breath  . Hemoptysis     (Consider location/radiation/quality/duration/timing/severity/associated sxs/prior Treatment) HPI Comments: 82 y.o. M with h/o asthma, HTN, HLD presenting with SOB, cough and hemoptysis.  Cough and SOB started yesterday and gradually worsened throughout the day.  He noticed some wheezing and tried albuterol inhaler a few times yesterday without much improvement in symptoms.  Today he had 3-4 episodes of hemoptysis.  Sputum had been clear and was streaked with blood today.  He has had intermittent subjective fever for 2 days.  Also endorses L ear pain x2d.  Denies any chest pain, throat pain.  Patient is a 82 y.o. male presenting with shortness of breath.  Shortness of Breath Severity:  Moderate Onset quality:  Sudden Duration:  2 days Timing:  Intermittent Progression:  Worsening Chronicity:  New Relieved by:  Nothing Worsened by:  Coughing Ineffective treatments:  Inhaler Associated symptoms: cough, ear pain, fever, hemoptysis, sputum production and wheezing   Associated symptoms: no abdominal pain, no chest pain, no headaches, no PND and no sore throat   Cough:    Cough characteristics:  Productive   Sputum characteristics:  Clear and bloody   Severity:  Moderate   Onset quality:  Gradual   Duration:  2 days   Timing:  Intermittent   Progression:  Worsening Ear pain:    Location:  Left   Severity:  Moderate   Onset quality:  Sudden   Duration:  2 days   Timing:  Constant   Progression:  Worsening   Chronicity:  New Fever:    Duration:  2 days   Timing:  Intermittent   Temp source:  Subjective   Progression:  Unchanged Wheezing:    Severity:  Moderate   Onset quality:  Gradual   Duration:  2 days   Timing:  Intermittent   Progression:  Worsening   Past Medical  History  Diagnosis Date  . Asthma   . Hypertension   . Gout   . Hypercholesteremia    Past Surgical History  Procedure Laterality Date  . Hernia repair     No family history on file. Social History  Substance Use Topics  . Smoking status: Former Smoker    Types: Cigarettes    Quit date: 06/16/1967  . Smokeless tobacco: None  . Alcohol Use: No    Review of Systems  Constitutional: Positive for fever.  HENT: Positive for ear pain. Negative for sore throat.   Respiratory: Positive for cough, hemoptysis, sputum production, shortness of breath and wheezing.   Cardiovascular: Negative for chest pain and PND.  Gastrointestinal: Negative for abdominal pain.  Neurological: Negative for headaches.  All other systems reviewed and are negative.     Allergies  Review of patient's allergies indicates no known allergies.  Home Medications   Prior to Admission medications   Medication Sig Start Date End Date Taking? Authorizing Provider  acetaminophen (TYLENOL) 500 MG tablet Take 1,000 mg by mouth every 6 (six) hours as needed for mild pain.    Historical Provider, MD  allopurinol (ZYLOPRIM) 100 MG tablet Take 100 mg by mouth daily.    Historical Provider, MD  amLODipine (NORVASC) 10 MG tablet Take 10 mg by mouth daily.    Historical Provider, MD  aspirin EC 81 MG tablet Take  81 mg by mouth daily.    Historical Provider, MD  febuxostat (ULORIC) 40 MG tablet Take 40 mg by mouth daily.    Historical Provider, MD  hydrochlorothiazide (MICROZIDE) 12.5 MG capsule Take 12.5 mg by mouth daily.    Historical Provider, MD  lisinopril (PRINIVIL,ZESTRIL) 20 MG tablet Take 20 mg by mouth daily.    Historical Provider, MD  predniSONE (DELTASONE) 20 MG tablet Take 60 mg daily for 2 days then 40 mg daily for 2 days then 20 mg daily for 2 days 10/05/13   Richardean Canal, MD  rosuvastatin (CRESTOR) 20 MG tablet Take 20 mg by mouth at bedtime.    Historical Provider, MD   BP 127/79 mmHg  Pulse 90   Temp(Src) 101.3 F (38.5 C) (Oral)  Resp 21  Ht 5\' 9"  (1.753 m)  Wt 91.173 kg  BMI 29.67 kg/m2  SpO2 93% Physical Exam  Constitutional: He is oriented to person, place, and time. He appears well-developed and well-nourished. No distress.  Coughing intermittently  HENT:  Head: Normocephalic and atraumatic.  Mouth/Throat: Oropharynx is clear and moist.  Tongue is blood-stained R TM unable to be visualized 2/2 cerumen L TM with purulent material over TM  Eyes: Conjunctivae and EOM are normal. Pupils are equal, round, and reactive to light. No scleral icterus.  Neck: Normal range of motion. Neck supple. No thyromegaly present.  Cardiovascular: Normal rate, regular rhythm, normal heart sounds and intact distal pulses.   No murmur heard. Pulmonary/Chest: Effort normal. No respiratory distress.  On 4L [place] with desats to 86% when talking, moving, or coughing Crackles in L base, diminished in R base Diffuse expiratory wheeze  Abdominal: Soft. Bowel sounds are normal. He exhibits no distension. There is no tenderness. There is no rebound and no guarding.  Musculoskeletal: He exhibits no tenderness.  2+ pitting edema b/l (reports this is chronic)  Lymphadenopathy:    He has no cervical adenopathy.  Neurological: He is alert and oriented to person, place, and time.  Skin: Skin is warm and dry. No rash noted.  Psychiatric: He has a normal mood and affect. His behavior is normal.    ED Course  Procedures (including critical care time) Labs Review Labs Reviewed  CBC WITH DIFFERENTIAL/PLATELET - Abnormal; Notable for the following:    WBC 17.9 (*)    Neutro Abs 16.2 (*)    All other components within normal limits  I-STAT CG4 LACTIC ACID, ED - Abnormal; Notable for the following:    Lactic Acid, Venous 3.01 (*)    All other components within normal limits  CULTURE, BLOOD (ROUTINE X 2)  CULTURE, BLOOD (ROUTINE X 2)  URINE CULTURE  COMPREHENSIVE METABOLIC PANEL  URINALYSIS, ROUTINE W  REFLEX MICROSCOPIC (NOT AT [facility])    Imaging Review Dg Chest Portable 1 View  06/20/2015  CLINICAL DATA:  Cough and shortness of breath since yesterday. EXAM: PORTABLE CHEST 1 VIEW COMPARISON:  October 05, 2013 FINDINGS: The heart size and mediastinal contours are within normal limits. There is a consolidation of left lung base with small left pleural effusion. There is no pulmonary edema. The visualized skeletal structures are unremarkable. IMPRESSION: Left lung base pneumonia with small left pleural effusion. Electronically Signed   By: Sherian Rein M.D.   On: 06/20/2015 18:05   I have personally reviewed and evaluated these images and lab results as part of my medical decision-making.   EKG Interpretation None      MDM   Final diagnoses:  None    82 y/o M presenting with cough, SOB, and hemoptysis x2 days.  Symptoms most likely consistent with CAP.  WBC 17.9, lactate 3.01, febrile to 101.3 - will initiate code sepsis protocol as meets criteria for severe sepsis.  IVF started.  CXR shows L base pneumonia.  After BCx and UCx collected, start treatment for CAP with Rocephin and Azithromycin.  Cr 2.06, last value 1.7 from 2015, unclear if AKI or worsening of baseline CKD.  Plan to admit to [facility] for hypoxia and CAP. Spoke with Dr. Maryfrances Bunnellanford who will admit.    Erasmo DownerAngela M Bacigalupo, MD 06/20/15 [id number]  Zadie Rhineonald Wickline, MD 06/20/15 [phone]

## 2015-06-20 NOTE — ED Provider Notes (Signed)
Patient seen/examined in the Emergency Department in conjunction with Resident Physician Provider  Patient reports cough/hemoptysis/fever Exam : awake/alert, crackles in left base.  Hypoxia noted that is improving with oxygen Plan: pt will be admitted for pneumonia    Zadie Rhineonald Tereka Thorley, MD 06/20/15 [id number]

## 2015-06-21 ENCOUNTER — Inpatient Hospital Stay (HOSPITAL_COMMUNITY): Payer: Medicare Other

## 2015-06-21 DIAGNOSIS — A403 Sepsis due to Streptococcus pneumoniae: Secondary | ICD-10-CM

## 2015-06-21 DIAGNOSIS — J13 Pneumonia due to Streptococcus pneumoniae: Secondary | ICD-10-CM | POA: Diagnosis present

## 2015-06-21 LAB — BASIC METABOLIC PANEL
ANION GAP: 8 (ref 5–15)
BUN: 31 mg/dL — ABNORMAL HIGH (ref 6–20)
CALCIUM: 8.2 mg/dL — AB (ref 8.9–10.3)
CO2: 23 mmol/L (ref 22–32)
Chloride: 109 mmol/L (ref 101–111)
Creatinine, Ser: 1.7 mg/dL — ABNORMAL HIGH (ref 0.61–1.24)
GFR, EST AFRICAN AMERICAN: 41 mL/min — AB (ref >60)
GFR, EST NON AFRICAN AMERICAN: 35 mL/min — AB (ref >60)
Glucose, Bld: 139 mg/dL — ABNORMAL HIGH (ref 65–99)
Potassium: 3.9 mmol/L (ref 3.5–5.1)
SODIUM: 140 mmol/L (ref 135–145)

## 2015-06-21 LAB — INFLUENZA PANEL BY PCR (TYPE A & B)
H1N1FLUPCR: NOT DETECTED
INFLAPCR: NEGATIVE
Influenza B By PCR: NEGATIVE

## 2015-06-21 LAB — EXPECTORATED SPUTUM ASSESSMENT W REFEX TO RESP CULTURE

## 2015-06-21 LAB — LEGIONELLA ANTIGEN, URINE

## 2015-06-21 LAB — CBC
HCT: 36.8 % — ABNORMAL LOW (ref 39.0–52.0)
Hemoglobin: 12.1 g/dL — ABNORMAL LOW (ref 13.0–17.0)
MCH: 28.7 pg (ref 26.0–34.0)
MCHC: 32.9 g/dL (ref 30.0–36.0)
MCV: 87.4 fL (ref 78.0–100.0)
PLATELETS: 188 10*3/uL (ref 150–400)
RBC: 4.21 MIL/uL — ABNORMAL LOW (ref 4.22–5.81)
RDW: 13.9 % (ref 11.5–15.5)
WBC: 17.7 10*3/uL — AB (ref 4.0–10.5)

## 2015-06-21 LAB — EXPECTORATED SPUTUM ASSESSMENT W GRAM STAIN, RFLX TO RESP C

## 2015-06-21 LAB — LACTIC ACID, PLASMA: LACTIC ACID, VENOUS: 1.5 mmol/L (ref 0.5–2.0)

## 2015-06-21 MED ORDER — VANCOMYCIN HCL 10 G IV SOLR
1750.0000 mg | Freq: Once | INTRAVENOUS | Status: AC
  Administered 2015-06-21: 1750 mg via INTRAVENOUS
  Filled 2015-06-21: qty 1750

## 2015-06-21 MED ORDER — VANCOMYCIN HCL 10 G IV SOLR
1250.0000 mg | INTRAVENOUS | Status: DC
  Administered 2015-06-22 – 2015-06-23 (×2): 1250 mg via INTRAVENOUS
  Filled 2015-06-21 (×3): qty 1250

## 2015-06-21 MED ORDER — ENOXAPARIN SODIUM 40 MG/0.4ML ~~LOC~~ SOLN
40.0000 mg | SUBCUTANEOUS | Status: DC
  Administered 2015-06-21 – 2015-06-26 (×5): 40 mg via SUBCUTANEOUS
  Filled 2015-06-21 (×5): qty 0.4

## 2015-06-21 NOTE — Progress Notes (Signed)
Utilization Review Completed.Marquie Aderhold T1/11/2015  

## 2015-06-21 NOTE — Progress Notes (Signed)
[facility] Progress Note  Patient: Benjamin Sanders ZOX:[id number]   PCP: Ezequiel Kayser, MD DOB: 1928/03/06   DOA: 06/20/2015   DOS: 06/21/2015   Date of Service: the patient was seen and examined on 06/21/2015  Subjective: Patient overnight required more oxygen, over the evening appears to feel better. Continues to have some cough. No chest pain. No fever. No nausea. Nutrition: Tolerating oral diet Activity: Working with physical therapy and ambulating in the hallway Last BM: 06/20/2015  Assessment and Plan: 1. Pneumococcal lobar pneumonia (HCC) Sepsis Patient presented with complaints of cough ongoing for last 3 days requiring oxygen with fever chills and leukocytosis and tachycardia and tachypnea. Chest x-ray shows left lower lobe pneumonia. Patient appears to have streptococcal pneumo antigen positive in the urine. Blood cultures sputum culture pending influenza PCR negative. We will continue with ceftriaxone and azithromycin also add vancomycin for resistant pneumococcal strain. Monitor clinical progress.  2. Chronic kidney disease stage III. Patient presented with mild worsening which has improved significantly currently. We will continue with hydration at present. Monitor for volume overload. Avoid nephrotoxic medication.  3. Gout. Continue allopurinol.  4. Essential hypertension. Holding blood pressure medications.   DVT Prophylaxis: subcutaneous Heparin Nutrition: Regular diet Advance goals of care discussion: DNR/DNI  Brief Summary of Hospitalization:  HPI: As per the H and P dictated on admission, "patient presented with three-day history of cough which is less fatigue and weakness. No chest pain no nausea no vomiting. No diarrhea no constipation. No recent travel history no recent hospitalization. No recent procedure." Daily update, Procedures: Admitted on 06/20/2015 Consultants: None Antibiotics: Anti-infectives    Start     Dose/Rate Route Frequency Ordered Stop    06/22/15 1100  vancomycin (VANCOCIN) 1,250 mg in sodium chloride 0.9 % 250 mL IVPB     1,250 mg 166.7 mL/hr over 90 Minutes Intravenous Every 24 hours 06/21/15 0915     06/21/15 0930  vancomycin (VANCOCIN) 1,750 mg in sodium chloride 0.9 % 500 mL IVPB     1,750 mg 250 mL/hr over 120 Minutes Intravenous  Once 06/21/15 0915 06/21/15 1206   06/20/15 1800  cefTRIAXone (ROCEPHIN) 1 g in dextrose 5 % 50 mL IVPB  Status:  Discontinued     1 g 100 mL/hr over 30 Minutes Intravenous  Once 06/20/15 1756 06/20/15 1758   06/20/15 1800  azithromycin (ZITHROMAX) 500 mg in dextrose 5 % 250 mL IVPB  Status:  Discontinued     500 mg 250 mL/hr over 60 Minutes Intravenous  Once 06/20/15 1756 06/20/15 1758   06/20/15 1800  azithromycin (ZITHROMAX) 500 mg in dextrose 5 % 250 mL IVPB     500 mg 250 mL/hr over 60 Minutes Intravenous Every 24 hours 06/20/15 1758     06/20/15 1800  cefTRIAXone (ROCEPHIN) 1 g in dextrose 5 % 50 mL IVPB     1 g 100 mL/hr over 30 Minutes Intravenous Every 24 hours 06/20/15 1758         Family Communication: family was present at bedside, at the time of interview.  Opportunity was given to ask question and all questions were answered satisfactorily.   Disposition:  Expected discharge date: 06/23/2015 Barriers to safe discharge: Hypoxia   Intake/Output Summary (Last 24 hours) at 06/21/15 1623 Last data filed at 06/21/15 1400  Gross per 24 hour  Intake 6048.75 ml  Output    825 ml  Net 5223.75 ml   Filed Weights   06/20/15 1704 06/20/15 2100  Weight: 91.173 kg (  201 lb) 92.08 kg (203 lb)    Objective: Physical Exam: Filed Vitals:   06/20/15 2339 06/21/15 0640 06/21/15 0845 06/21/15 1158  BP: 128/65  140/68 123/75  Pulse: 84 72 80 70  Temp: 98.5 F (36.9 C)  98.4 F (36.9 C) 98.4 F (36.9 C)  TempSrc: Oral  Oral Oral  Resp: 19 21 20 16   Height:      Weight:      SpO2: 92% 94% 90% 93%     General: Appear in moderate distress, no Rash; Oral Mucosa  moist. Cardiovascular: S1 and S2 Present, no Murmur, no JVD Respiratory: Bilateral Air entry present and left basal Crackles, no wheezes Abdomen: Bowel Sound present, Soft and no tenderness Extremities: No Pedal edema, no calf tenderness Neurology: Grossly no focal neuro deficit.  Data Reviewed: CBC:  Recent Labs Lab 06/20/15 1755 06/21/15 0357  WBC 17.9* 17.7*  NEUTROABS 16.2*  --   HGB 14.7 12.1*  HCT 43.4 36.8*  MCV 87.0 87.4  PLT 253 188   Basic Metabolic Panel:  Recent Labs Lab 06/20/15 1757 06/21/15 0357  NA 136 140  K 3.8 3.9  CL 102 109  CO2 21* 23  GLUCOSE 157* 139*  BUN 34* 31*  CREATININE 2.06* 1.70*  CALCIUM 9.8 8.2*   Liver Function Tests:  Recent Labs Lab 06/20/15 1757  AST 23  ALT 21  ALKPHOS 75  BILITOT 0.7  PROT 7.2  ALBUMIN 3.6   No results for input(s): LIPASE, AMYLASE in the last 168 hours. No results for input(s): AMMONIA in the last 168 hours.  Cardiac Enzymes: No results for input(s): CKTOTAL, CKMB, CKMBINDEX, TROPONINI in the last 168 hours.  BNP (last 3 results) No results for input(s): BNP in the last 8760 hours.  CBG: No results for input(s): GLUCAP in the last 168 hours.  Recent Results (from the past 240 hour(s))  Culture, blood (routine x 2)     Status: None (Preliminary result)   Collection Time: 06/20/15  6:00 PM  Result Value Ref Range Status   Specimen Description BLOOD RIGHT ANTECUBITAL  Final   Special Requests BOTTLES DRAWN AEROBIC AND ANAEROBIC 5CC  Final   Culture NO GROWTH < 24 HOURS  Final   Report Status PENDING  Incomplete  MRSA PCR Screening     Status: None   Collection Time: 06/20/15  9:09 PM  Result Value Ref Range Status   MRSA by PCR NEGATIVE NEGATIVE Final    Comment:        The GeneXpert MRSA Assay (FDA approved for NASAL specimens only), is one component of a comprehensive MRSA colonization surveillance program. It is not intended to diagnose MRSA infection nor to guide or monitor  treatment for MRSA infections.   Urine culture     Status: None (Preliminary result)   Collection Time: 06/20/15 10:57 PM  Result Value Ref Range Status   Specimen Description URINE, RANDOM  Final   Special Requests NONE  Final   Culture NO GROWTH < 12 HOURS  Final   Report Status PENDING  Incomplete  Culture, sputum-assessment     Status: None   Collection Time: 06/21/15  5:03 AM  Result Value Ref Range Status   Specimen Description EXPECTORATED SPUTUM  Final   Special Requests NONE  Final   Sputum evaluation   Final    THIS SPECIMEN IS ACCEPTABLE. RESPIRATORY CULTURE REPORT TO FOLLOW.   Report Status 06/21/2015 FINAL  Final     Studies: Dg Chest [facility]  1 View  06/21/2015  CLINICAL DATA:  Pneumococcal pneumonia. EXAM: PORTABLE CHEST 1 VIEW COMPARISON:  06/20/2015. FINDINGS: Cardiomegaly with normal pulmonary vascularity. Left mid lung and left lower lobe infiltrate consistent pneumonia. Small left pleural effusion. No acute bony abnormality . IMPRESSION: 1. Progressive lead mid lung and left lower lobe infiltrate consistent with pneumonia. Small left pleural effusion. 2.  Cardiomegaly.  No pulmonary venous congestion. Electronically Signed   By: Maisie Fushomas  Register   On: 06/21/2015 08:14   Dg Chest Portable 1 View  06/20/2015  CLINICAL DATA:  Cough and shortness of breath since yesterday. EXAM: PORTABLE CHEST 1 VIEW COMPARISON:  October 05, 2013 FINDINGS: The heart size and mediastinal contours are within normal limits. There is a consolidation of left lung base with small left pleural effusion. There is no pulmonary edema. The visualized skeletal structures are unremarkable. IMPRESSION: Left lung base pneumonia with small left pleural effusion. Electronically Signed   By: Sherian ReinWei-Chen  Lin M.D.   On: 06/20/2015 18:05     Scheduled Meds: . allopurinol  100 mg Oral Daily  . antiseptic oral rinse  7 mL Mouth Rinse BID  . aspirin EC  81 mg Oral Daily  . azithromycin  500 mg Intravenous Q24H  .  cefTRIAXone (ROCEPHIN)  IV  1 g Intravenous Q24H  . enoxaparin (LOVENOX) injection  40 mg Subcutaneous Q24H  . sodium chloride  3 mL Intravenous Q12H  . [START ON 06/22/2015] vancomycin  1,250 mg Intravenous Q24H   Continuous Infusions: . sodium chloride 125 mL/hr at 06/21/15 1613   PRN Meds: acetaminophen **OR** acetaminophen, albuterol  Time spent: 30 minutes  Author: Lynden OxfordPranav Lula Michaux, MD [facility] Pager: [phone] 06/21/2015 4:23 PM  If 7PM-7AM, please contact night-coverage at [email or web address], password [email or web address]

## 2015-06-21 NOTE — Evaluation (Signed)
Physical Therapy Evaluation Patient Details Name: Benjamin DoomsWilliam Sanders MRN: [id number] DOB: 1927/12/27 Today's Date: 06/21/2015   History of Present Illness  Patient is a 82 y/o male with hx of HTN who presents with acute cough and hemopytsis. In ED, pt febrile to 101.64F, tachycardic, tachypneic, and hypoxic to 88% on RA with leukocytosis and an elevated lactate.CXR- left base opacity. Code sepsis was called.  Clinical Impression  Patient presents with decreased balance and dyspnea on exertion impacting safe mobility. Ambulating Min guard due to mild instability. Sp02 dropped to 87% on 6L/min 02. Pt highly motivated to walk. Pt independent at home and walks 3 miles per day. Encouraged ambulation a few times per day to improve balance/endurance to to wean down 02. Has support from wife at home. Will follow acutely to maximize independence and mobilty prior to return home.    Follow Up Recommendations Supervision for mobility/OOB;Home health PT (pending progress)    Equipment Recommendations  None recommended by PT    Recommendations for Other Services OT consult     Precautions / Restrictions Precautions Precautions: None Restrictions Weight Bearing Restrictions: No      Mobility  Bed Mobility Overal bed mobility: Modified Independent             General bed mobility comments: Sitting in chair upon PT arrival.   Transfers Overall transfer level: Needs assistance Equipment used: None Transfers: Sit to/from Stand Sit to Stand: Min guard         General transfer comment: Min guard for safety and to manage lines/leads.  Ambulation/Gait Ambulation/Gait assistance: Min guard Ambulation Distance (Feet): 150 Feet Assistive device: None Gait Pattern/deviations: Step-through pattern;Decreased stride length;Staggering right;Staggering left Gait velocity: decreased   General Gait Details: Mildly unsteady gait but improved with increased distance. 2/4 DOE. Sp02 dropped to 87% on  6L/min 02. Cues for pursed lip breathing.  Stairs            Wheelchair Mobility    Modified Rankin (Stroke Patients Only)       Balance Overall balance assessment: Needs assistance Sitting-balance support: Feet supported;No upper extremity supported Sitting balance-Leahy Scale: Good     Standing balance support: During functional activity Standing balance-Leahy Scale: Fair Standing balance comment: Able to perform dynamic standing and try to organize lines/leads without LOB or difficulty. No UE support needed.                              Pertinent Vitals/Pain Pain Assessment: No/denies pain    Home Living Family/patient expects to be discharged to:: Private residence Living Arrangements: Spouse/significant other Available Help at Discharge: Family;Available 24 hours/day Type of Home: House Home Access: Stairs to enter   [employer] CorporationEntrance Stairs-Number of Steps: 3 at front with 2 rails and 1 at back with grab bar Home Layout: One level Home Equipment: Shower seat;Grab bars - tub/shower      Prior Function Level of Independence: Independent         Comments: walks 3 miles a day     Hand Dominance   Dominant Hand: Right    Extremity/Trunk Assessment   Upper Extremity Assessment: Overall WFL for tasks assessed           Lower Extremity Assessment: Overall WFL for tasks assessed         Communication   Communication: No difficulties  Cognition Arousal/Alertness: Awake/alert Behavior During Therapy: WFL for tasks assessed/performed Overall Cognitive Status: Within Functional Limits for tasks  assessed                      General Comments      Exercises        Assessment/Plan    PT Assessment Patient needs continued PT services  PT Diagnosis Difficulty walking   PT Problem List Cardiopulmonary status limiting activity;Decreased balance;Decreased activity tolerance  PT Treatment Interventions Balance training;Stair  training;Gait training;Functional mobility training;Therapeutic activities;Therapeutic exercise;Patient/family education   PT Goals (Current goals can be found in the Care Plan section) Acute Rehab PT Goals Patient Stated Goal: to return home  PT Goal Formulation: With patient Time For Goal Achievement: 07/05/15 Potential to Achieve Goals: Good    Frequency Min 3X/week   Barriers to discharge Inaccessible home environment steps to climb to enter home    Co-evaluation               End of Session Equipment Utilized During Treatment: Gait belt;Oxygen Activity Tolerance: Treatment limited secondary to medical complications (Comment);Patient tolerated treatment well (except drop in Sp02 to 87% on 6L) Patient left: in chair;with call bell/phone within reach;with chair alarm set Nurse Communication: Mobility status;Other (comment) (drop in 02)         Time: 1500-1515 PT Time Calculation (min) (ACUTE ONLY): 15 min   Charges:   PT Evaluation $PT Eval Moderate Complexity: 1 Procedure     PT G Codes:        Benjamin Sanders A Benjamin Sanders 06/21/2015, 3:55 PM Mylo Red, PT, DPT [phone]

## 2015-06-21 NOTE — Progress Notes (Signed)
Pt admitted from ED with sats fluctuating between 88-92% on 6 liters [place]. Pt received neb dose prior to transfer and was in no appear distress despite low sats. Attempted to increase sats by placing him on Veni mask and administering another breathing treatment when due, however sats still hovering 88-90%. RT in agreement that next step would be non-rebreather. NP on call made aware and pt placed on nonrebreather. Sats have improved to 94% at this time. Will continue to monitor

## 2015-06-21 NOTE — Evaluation (Signed)
Occupational Therapy Evaluation Patient Details Name: Benjamin Sanders MRN: [id number] DOB: [birth year] Today's Date: 06/21/2015    History of Present Illness Patient is a 82 y/o male with hx of HTN who presents with acute cough and hemopytsis. In ED, pt febrile to 101.56F, tachycardic, tachypneic, and hypoxic to 88% on RA with leukocytosis and an elevated lactate.CXR- left base opacity. Code sepsis was called.   Clinical Impression   This 82 yo male admitted with above presents to acute OT with decreased balance, decrease in sats with activity requiring 6 liters of O2, decreased mobility all affecting his ability to care for himself at an independent level as he was pta. He will benefit from acute OT with follow up HHOT.             Precautions / Restrictions Precautions Precautions: None Restrictions Weight Bearing Restrictions: No      Mobility Bed Mobility Overal bed mobility: Modified Independent             General bed mobility comments: Sitting in chair upon PT arrival.   Transfers Overall transfer level: Needs assistance Equipment used: None Transfers: Sit to/from Stand Sit to Stand: Min guard         General transfer comment: Min guard for safety and to manage lines/leads.    Balance Overall balance assessment: Needs assistance Sitting-balance support: Feet supported;No upper extremity supported Sitting balance-Leahy Scale: Good     Standing balance support: During functional activity Standing balance-Leahy Scale: Fair Standing balance comment: Able to perform dynamic standing and try to organize lines/leads without LOB or difficulty. No UE support needed.                             ADL Overall ADL's : Needs assistance/impaired Eating/Feeding: Independent;Sitting   Grooming: Set up;Sitting   Upper Body Bathing: Set up;Sitting   Lower Body Bathing: Minimal assistance (with minguard A sit<>stand)   Upper Body Dressing : Set up;Sitting    Lower Body Dressing: Minimal assistance (with minguard A sit<>stand)   Toilet Transfer: Min guard;Ambulation;Regular Toilet;Grab bars   Toileting- Clothing Manipulation and Hygiene: Min guard;Sit to/from stand                         Pertinent Vitals/Pain Pain Assessment: No/denies pain     Hand Dominance Right   Extremity/Trunk Assessment Upper Extremity Assessment Upper Extremity Assessment: Overall WFL for tasks assessed   Lower Extremity Assessment Lower Extremity Assessment: Overall WFL for tasks assessed       Communication Communication Communication: No difficulties   Cognition Arousal/Alertness: Awake/alert Behavior During Therapy: WFL for tasks assessed/performed Overall Cognitive Status: Within Functional Limits for tasks assessed                                Home Living Family/patient expects to be discharged to:: Private residence Living Arrangements: Spouse/significant other Available Help at Discharge: Family;Available 24 hours/day Type of Home: House Home Access: Stairs to enter [employer] of Steps: 3 at front with 2 rails and 1 at back with grab bar   Home Layout: One level     Bathroom Shower/Tub: Tub/shower unit;Curtain   Firefighter: Standard     Home Equipment: Shower seat;Grab bars - tub/shower          Prior Functioning/Environment Level of Independence: Independent  Comments: walks 3 miles a day    OT Diagnosis: Generalized weakness   OT Problem List: Cardiopulmonary status limiting activity (currently needs 6 liters of O2)      OT Goals(Current goals can be found in the care plan section) Acute Rehab OT Goals Patient Stated Goal: to return home  OT Goal Formulation: With patient Time For Goal Achievement: 06/28/15 Potential to Achieve Goals: Good  OT Frequency:                End of Session Equipment Utilized During Treatment: Gait belt;Oxygen (6 liters) Nurse  Communication: Mobility status  Activity Tolerance: Patient tolerated treatment well Patient left: in chair;with call bell/phone within reach;with chair alarm set   Time: [id number] OT Time Calculation (min): 25 min Charges:  OT General Charges $OT Visit: 1 Procedure OT Evaluation $OT Eval Moderate Complexity: 1 Procedure OT Treatments $Self Care/Home Management : 8-22 mins  Evette GeorgesLeonard, Rocky Rishel Eva [phone] 06/21/2015, 4:00 PM

## 2015-06-22 LAB — BASIC METABOLIC PANEL
ANION GAP: 9 (ref 5–15)
BUN: 28 mg/dL — AB (ref 6–20)
CALCIUM: 8.7 mg/dL — AB (ref 8.9–10.3)
CO2: 23 mmol/L (ref 22–32)
Chloride: 107 mmol/L (ref 101–111)
Creatinine, Ser: 1.37 mg/dL — ABNORMAL HIGH (ref 0.61–1.24)
GFR calc non Af Amer: 45 mL/min — ABNORMAL LOW (ref >60)
GFR, EST AFRICAN AMERICAN: 53 mL/min — AB (ref >60)
GLUCOSE: 106 mg/dL — AB (ref 65–99)
POTASSIUM: 3.5 mmol/L (ref 3.5–5.1)
Sodium: 139 mmol/L (ref 135–145)

## 2015-06-22 LAB — BLOOD GAS, ARTERIAL
Acid-base deficit: 1.9 mmol/L (ref 0.0–2.0)
Bicarbonate: 22.6 mEq/L (ref 20.0–24.0)
Drawn by: 235881
FIO2: 1
O2 SAT: 93.4 %
PCO2 ART: 40.3 mmHg (ref 35.0–45.0)
PH ART: 7.368 (ref 7.350–7.450)
PO2 ART: 83.6 mmHg (ref 80.0–100.0)
Patient temperature: 98.6
TCO2: 23.9 mmol/L (ref 0–100)

## 2015-06-22 LAB — CBC WITH DIFFERENTIAL/PLATELET
Basophils Absolute: 0 10*3/uL (ref 0.0–0.1)
Basophils Relative: 0 %
EOS PCT: 0 %
Eosinophils Absolute: 0.1 10*3/uL (ref 0.0–0.7)
HEMATOCRIT: 37.4 % — AB (ref 39.0–52.0)
Hemoglobin: 12.3 g/dL — ABNORMAL LOW (ref 13.0–17.0)
LYMPHS PCT: 10 %
Lymphs Abs: 1.3 10*3/uL (ref 0.7–4.0)
MCH: 29 pg (ref 26.0–34.0)
MCHC: 32.9 g/dL (ref 30.0–36.0)
MCV: 88.2 fL (ref 78.0–100.0)
MONO ABS: 1 10*3/uL (ref 0.1–1.0)
MONOS PCT: 7 %
NEUTROS ABS: 11.5 10*3/uL — AB (ref 1.7–7.7)
Neutrophils Relative %: 83 %
Platelets: 196 10*3/uL (ref 150–400)
RBC: 4.24 MIL/uL (ref 4.22–5.81)
RDW: 14 % (ref 11.5–15.5)
WBC: 13.9 10*3/uL — ABNORMAL HIGH (ref 4.0–10.5)

## 2015-06-22 LAB — URINE CULTURE: CULTURE: NO GROWTH

## 2015-06-22 MED ORDER — IPRATROPIUM-ALBUTEROL 0.5-2.5 (3) MG/3ML IN SOLN
3.0000 mL | Freq: Four times a day (QID) | RESPIRATORY_TRACT | Status: DC
  Administered 2015-06-22 – 2015-06-25 (×12): 3 mL via RESPIRATORY_TRACT
  Filled 2015-06-22 (×12): qty 3

## 2015-06-22 MED ORDER — METHYLPREDNISOLONE SODIUM SUCC 125 MG IJ SOLR
60.0000 mg | INTRAMUSCULAR | Status: DC
  Administered 2015-06-22 – 2015-06-23 (×2): 60 mg via INTRAVENOUS
  Filled 2015-06-22 (×2): qty 2

## 2015-06-22 MED ORDER — GUAIFENESIN ER 600 MG PO TB12
600.0000 mg | ORAL_TABLET | Freq: Two times a day (BID) | ORAL | Status: DC
  Administered 2015-06-22 – 2015-06-26 (×9): 600 mg via ORAL
  Filled 2015-06-22 (×9): qty 1

## 2015-06-22 NOTE — Progress Notes (Signed)
[facility] Progress Note  Patient: Benjamin Sanders:[id number]   PCP: Ezequiel KayserPERINI,MARK A, MD DOB: October 03, 1927   DOA: 06/20/2015   DOS: 06/22/2015   Date of Service: the patient was seen and examined on 06/22/2015  Subjective: Patient mentions he is feeling better but not at his baseline. Cough is productive. Some nausea. No chest pain or abdominal pain. Complains of more fatigue today. Nutrition: Tolerating oral diet Activity: Working with physical therapy and ambulating in the hallway Last BM: 06/21/2015  Assessment and Plan: 1. Pneumococcal lobar pneumonia (HCC) Sepsis Patient presented with complaints of cough ongoing for last 3 days requiring oxygen with fever chills and leukocytosis and tachycardia and tachypnea. Chest x-ray shows left lower lobe pneumonia. Patient appears to have streptococcal pneumo antigen positive in the urine. Blood cultures sputum culture pending influenza PCR negative. Continue ceftriaxone azithromycin and vancomycin. Add steroid, add Mucinex, add flutter device. Add DuoNeb for wheezing, BiPAP when necessary for distress ABG shows hypoxia with 93% saturation on nonrebreather.  2. Chronic kidney disease stage III. Patient presented with mild worsening which has improved significantly currently. We will continue with hydration at present, reduce the rate to 75 mL per hour Avoid nephrotoxic medication.  3. Gout. Continue allopurinol.  4. Essential hypertension. Holding blood pressure medications.   DVT Prophylaxis: subcutaneous Heparin Nutrition: Regular diet Advance goals of care discussion: DNR/DNI  Brief Summary of Hospitalization:  HPI: As per the H and P dictated on admission, "patient presented with three-day history of cough which is less fatigue and weakness. No chest pain no nausea no vomiting. No diarrhea no constipation. No recent travel history no recent hospitalization. No recent procedure." Daily update, Procedures: Admitted on 06/20/2015,  started on antibiotics, vancomycin added 06/21/2015. Consultants: None Antibiotics: Anti-infectives    Start     Dose/Rate Route Frequency Ordered Stop   06/22/15 1100  vancomycin (VANCOCIN) 1,250 mg in sodium chloride 0.9 % 250 mL IVPB     1,250 mg 166.7 mL/hr over 90 Minutes Intravenous Every 24 hours 06/21/15 0915     06/21/15 0930  vancomycin (VANCOCIN) 1,750 mg in sodium chloride 0.9 % 500 mL IVPB     1,750 mg 250 mL/hr over 120 Minutes Intravenous  Once 06/21/15 0915 06/21/15 1206   06/20/15 1800  cefTRIAXone (ROCEPHIN) 1 g in dextrose 5 % 50 mL IVPB  Status:  Discontinued     1 g 100 mL/hr over 30 Minutes Intravenous  Once 06/20/15 1756 06/20/15 1758   06/20/15 1800  azithromycin (ZITHROMAX) 500 mg in dextrose 5 % 250 mL IVPB  Status:  Discontinued     500 mg 250 mL/hr over 60 Minutes Intravenous  Once 06/20/15 1756 06/20/15 1758   06/20/15 1800  azithromycin (ZITHROMAX) 500 mg in dextrose 5 % 250 mL IVPB     500 mg 250 mL/hr over 60 Minutes Intravenous Every 24 hours 06/20/15 1758     06/20/15 1800  cefTRIAXone (ROCEPHIN) 1 g in dextrose 5 % 50 mL IVPB     1 g 100 mL/hr over 30 Minutes Intravenous Every 24 hours 06/20/15 1758        Family Communication: family was present at bedside, at the time of interview.  Opportunity was given to ask question and all questions were answered satisfactorily.   Disposition:  Expected discharge date: 06/25/2015 Barriers to safe discharge: Hypoxia   Intake/Output Summary (Last 24 hours) at 06/22/15 1341 Last data filed at 06/22/15 0700  Gross per 24 hour  Intake   2850  ml  Output    525 ml  Net   2325 ml   Filed Weights   06/20/15 1704 06/20/15 2100  Weight: 91.173 kg (201 lb) 92.08 kg (203 lb)    Objective: Physical Exam: Filed Vitals:   06/22/15 0436 06/22/15 0517 06/22/15 0802 06/22/15 1225  BP: 143/65  135/75 145/79  Pulse: 79 69 66 75  Temp: 99.3 F (37.4 C)  97.7 F (36.5 C) 97.6 F (36.4 C)  TempSrc: Oral  Oral  Oral  Resp: 23 21 14 17   Height:      Weight:      SpO2: 92% 90% 96% 94%    General: Appear in moderate distress, no Rash; Oral Mucosa moist. Cardiovascular: S1 and S2 Present, no Murmur, no JVD Respiratory: Bilateral Air entry present and increased bilateral crepitation with rhonchi and occasional expiratory wheezes Abdomen: Bowel Sound present, Soft and no tenderness Extremities: No Pedal edema, no calf tenderness  Data Reviewed: CBC:  Recent Labs Lab 06/20/15 1755 06/21/15 0357 06/22/15 0915  WBC 17.9* 17.7* 13.9*  NEUTROABS 16.2*  --  11.5*  HGB 14.7 12.1* 12.3*  HCT 43.4 36.8* 37.4*  MCV 87.0 87.4 88.2  PLT 253 188 196   Basic Metabolic Panel:  Recent Labs Lab 06/20/15 1757 06/21/15 0357 06/22/15 0249  NA 136 140 139  K 3.8 3.9 3.5  CL 102 109 107  CO2 21* 23 23  GLUCOSE 157* 139* 106*  BUN 34* 31* 28*  CREATININE 2.06* 1.70* 1.37*  CALCIUM 9.8 8.2* 8.7*   Liver Function Tests:  Recent Labs Lab 06/20/15 1757  AST 23  ALT 21  ALKPHOS 75  BILITOT 0.7  PROT 7.2  ALBUMIN 3.6   No results for input(s): LIPASE, AMYLASE in the last 168 hours. No results for input(s): AMMONIA in the last 168 hours.  Cardiac Enzymes: No results for input(s): CKTOTAL, CKMB, CKMBINDEX, TROPONINI in the last 168 hours.  BNP (last 3 results) No results for input(s): BNP in the last 8760 hours.  CBG: No results for input(s): GLUCAP in the last 168 hours.  Recent Results (from the past 240 hour(s))  Culture, blood (routine x 2)     Status: None (Preliminary result)   Collection Time: 06/20/15  6:00 PM  Result Value Ref Range Status   Specimen Description BLOOD RIGHT ANTECUBITAL  Final   Special Requests BOTTLES DRAWN AEROBIC AND ANAEROBIC 5CC  Final   Culture NO GROWTH 2 DAYS  Final   Report Status PENDING  Incomplete  Culture, blood (routine x 2)     Status: None (Preliminary result)   Collection Time: 06/20/15  6:10 PM  Result Value Ref Range Status   Specimen  Description BLOOD RIGHT HAND  Final   Special Requests BOTTLES DRAWN AEROBIC AND ANAEROBIC 5CC  Final   Culture NO GROWTH 1 DAY  Final   Report Status PENDING  Incomplete  MRSA PCR Screening     Status: None   Collection Time: 06/20/15  9:09 PM  Result Value Ref Range Status   MRSA by PCR NEGATIVE NEGATIVE Final    Comment:        The GeneXpert MRSA Assay (FDA approved for NASAL specimens only), is one component of a comprehensive MRSA colonization surveillance program. It is not intended to diagnose MRSA infection nor to guide or monitor treatment for MRSA infections.   Urine culture     Status: None   Collection Time: 06/20/15 10:57 PM  Result Value Ref Range Status  Specimen Description URINE, RANDOM  Final   Special Requests NONE  Final   Culture NO GROWTH 2 DAYS  Final   Report Status 06/22/2015 FINAL  Final  Culture, sputum-assessment     Status: None   Collection Time: 06/21/15  5:03 AM  Result Value Ref Range Status   Specimen Description EXPECTORATED SPUTUM  Final   Special Requests NONE  Final   Sputum evaluation   Final    THIS SPECIMEN IS ACCEPTABLE. RESPIRATORY CULTURE REPORT TO FOLLOW.   Report Status 06/21/2015 FINAL  Final  Culture, respiratory (NON-Expectorated)     Status: None (Preliminary result)   Collection Time: 06/21/15  5:03 AM  Result Value Ref Range Status   Specimen Description SPUTUM  Final   Special Requests NONE  Final   Gram Stain PENDING  Incomplete   Culture   Final    Culture reincubated for better growth Performed at [facility]    Report Status PENDING  Incomplete     Studies: No results found.   Scheduled Meds: . allopurinol  100 mg Oral Daily  . antiseptic oral rinse  7 mL Mouth Rinse BID  . aspirin EC  81 mg Oral Daily  . azithromycin  500 mg Intravenous Q24H  . cefTRIAXone (ROCEPHIN)  IV  1 g Intravenous Q24H  . enoxaparin (LOVENOX) injection  40 mg Subcutaneous Q24H  . guaiFENesin  600 mg Oral BID  .  ipratropium-albuterol  3 mL Nebulization Q6H  . methylPREDNISolone (SOLU-MEDROL) injection  60 mg Intravenous Q24H  . sodium chloride  3 mL Intravenous Q12H  . vancomycin  1,250 mg Intravenous Q24H   Continuous Infusions: . sodium chloride 75 mL/hr at 06/22/15 0901   PRN Meds: acetaminophen **OR** acetaminophen, albuterol  Time spent: 30 minutes  Author: Lynden Oxford, MD [facility] Pager: [phone] 06/22/2015 1:41 PM  If 7PM-7AM, please contact night-coverage at [email or web address], password [facility]

## 2015-06-23 LAB — CULTURE, RESPIRATORY W GRAM STAIN

## 2015-06-23 LAB — CBC WITH DIFFERENTIAL/PLATELET
Basophils Absolute: 0 10*3/uL (ref 0.0–0.1)
Basophils Relative: 0 %
EOS PCT: 0 %
Eosinophils Absolute: 0 10*3/uL (ref 0.0–0.7)
HEMATOCRIT: 36.1 % — AB (ref 39.0–52.0)
Hemoglobin: 11.9 g/dL — ABNORMAL LOW (ref 13.0–17.0)
LYMPHS ABS: 0.8 10*3/uL (ref 0.7–4.0)
LYMPHS PCT: 10 %
MCH: 28.9 pg (ref 26.0–34.0)
MCHC: 33 g/dL (ref 30.0–36.0)
MCV: 87.6 fL (ref 78.0–100.0)
MONO ABS: 0.1 10*3/uL (ref 0.1–1.0)
Monocytes Relative: 2 %
NEUTROS ABS: 7.2 10*3/uL (ref 1.7–7.7)
Neutrophils Relative %: 88 %
PLATELETS: 224 10*3/uL (ref 150–400)
RBC: 4.12 MIL/uL — ABNORMAL LOW (ref 4.22–5.81)
RDW: 13.6 % (ref 11.5–15.5)
WBC: 8.1 10*3/uL (ref 4.0–10.5)

## 2015-06-23 LAB — COMPREHENSIVE METABOLIC PANEL
ALT: 21 U/L (ref 17–63)
AST: 17 U/L (ref 15–41)
Albumin: 2.3 g/dL — ABNORMAL LOW (ref 3.5–5.0)
Alkaline Phosphatase: 58 U/L (ref 38–126)
Anion gap: 8 (ref 5–15)
BILIRUBIN TOTAL: 0.6 mg/dL (ref 0.3–1.2)
BUN: 25 mg/dL — AB (ref 6–20)
CHLORIDE: 109 mmol/L (ref 101–111)
CO2: 23 mmol/L (ref 22–32)
CREATININE: 1.25 mg/dL — AB (ref 0.61–1.24)
Calcium: 8.9 mg/dL (ref 8.9–10.3)
GFR, EST AFRICAN AMERICAN: 59 mL/min — AB (ref >60)
GFR, EST NON AFRICAN AMERICAN: 51 mL/min — AB (ref >60)
Glucose, Bld: 176 mg/dL — ABNORMAL HIGH (ref 65–99)
POTASSIUM: 3.6 mmol/L (ref 3.5–5.1)
Sodium: 140 mmol/L (ref 135–145)
TOTAL PROTEIN: 5.8 g/dL — AB (ref 6.5–8.1)

## 2015-06-23 LAB — CULTURE, RESPIRATORY: CULTURE: NORMAL

## 2015-06-23 NOTE — Progress Notes (Signed)
[facility] Progress Note  Patient: Benjamin Sanders ZOX:[id number]   PCP: Ezequiel KayserPERINI,MARK A, MD DOB: 07-16-1927   DOA: 06/20/2015   DOS: 06/23/2015   Date of Service: the patient was seen and examined on 06/23/2015  Subjective: Patient mentions he is coughing less his breathing is better. No chest pain. Nutrition: Tolerating oral diet Activity: Ambulating in the room Last BM: 06/23/2015  Assessment and Plan: 1. Pneumococcal lobar pneumonia (HCC) Sepsis Patient presented with complaints of cough ongoing for last 3 days prior to arrival, requiring oxygen with fever chills and leukocytosis and tachycardia and tachypnea. Chest x-ray shows left lower lobe pneumonia. Patient appears to have streptococcal pneumo antigen positive in the urine. Blood cultures negative, sputum culture negative, influenza PCR negative. Patient is showing slow improvement, at present Continue ceftriaxone azithromycin and discontinue vancomycin. MRSA PCR negative Continue steroid, Mucinex, flutter device,  DuoNeb for wheezing, BiPAP when necessary for distress. ABG shows hypoxia with 93% saturation on nonrebreather.  2. Chronic kidney disease stage III. Patient presented with mild worsening which has improved significantly currently. Hold IV fluids Avoid nephrotoxic medication.  3. Gout. Continue allopurinol.  4. Essential hypertension. Holding blood pressure medications.   DVT Prophylaxis: subcutaneous Heparin Nutrition: Regular diet Advance goals of care discussion: DNR/DNI  Brief Summary of Hospitalization:  HPI: As per the H and P dictated on admission, "patient presented with three-day history of cough which is less fatigue and weakness. No chest pain no nausea no vomiting. No diarrhea no constipation. No recent travel history no recent hospitalization. No recent procedure." Daily update, Procedures: Admitted on 06/20/2015, started on antibiotics, vancomycin added 06/21/2015. 06/23/2015 sputum culture  negative, Consultants: None Antibiotics: Anti-infectives    Start     Dose/Rate Route Frequency Ordered Stop   06/22/15 1100  vancomycin (VANCOCIN) 1,250 mg in sodium chloride 0.9 % 250 mL IVPB     1,250 mg 166.7 mL/hr over 90 Minutes Intravenous Every 24 hours 06/21/15 0915     06/21/15 0930  vancomycin (VANCOCIN) 1,750 mg in sodium chloride 0.9 % 500 mL IVPB     1,750 mg 250 mL/hr over 120 Minutes Intravenous  Once 06/21/15 0915 06/21/15 1206   06/20/15 1800  cefTRIAXone (ROCEPHIN) 1 g in dextrose 5 % 50 mL IVPB  Status:  Discontinued     1 g 100 mL/hr over 30 Minutes Intravenous  Once 06/20/15 1756 06/20/15 1758   06/20/15 1800  azithromycin (ZITHROMAX) 500 mg in dextrose 5 % 250 mL IVPB  Status:  Discontinued     500 mg 250 mL/hr over 60 Minutes Intravenous  Once 06/20/15 1756 06/20/15 1758   06/20/15 1800  azithromycin (ZITHROMAX) 500 mg in dextrose 5 % 250 mL IVPB     500 mg 250 mL/hr over 60 Minutes Intravenous Every 24 hours 06/20/15 1758     06/20/15 1800  cefTRIAXone (ROCEPHIN) 1 g in dextrose 5 % 50 mL IVPB     1 g 100 mL/hr over 30 Minutes Intravenous Every 24 hours 06/20/15 1758        Family Communication: family was present at bedside, at the time of interview.  Opportunity was given to ask question and all questions were answered satisfactorily.   Disposition:  Expected discharge date: 06/25/2015 Barriers to safe discharge: Hypoxia   Intake/Output Summary (Last 24 hours) at 06/23/15 1323 Last data filed at 06/23/15 1000  Gross per 24 hour  Intake   2498 ml  Output    300 ml  Net   2198 ml  Filed Weights   06/20/15 1704 06/20/15 2100  Weight: 91.173 kg (201 lb) 92.08 kg (203 lb)    Objective: Physical Exam: Filed Vitals:   06/23/15 0800 06/23/15 0802 06/23/15 1000 06/23/15 1251  BP: 145/86  138/86 158/70  Pulse: 66  81 71  Temp: 97.8 F (36.6 C)   97.8 F (36.6 C)  TempSrc: Oral   Oral  Resp: 19  13 19   Height:      Weight:      SpO2: 92% 92%  93% 94%    General: Appear in moderate distress, no Rash; Oral Mucosa moist. Cardiovascular: S1 and S2 Present, no Murmur, no JVD Respiratory: Bilateral Air entry present and increased bilateral crepitation with rhonchi and occasional expiratory wheezes Abdomen: Bowel Sound present, Soft and no tenderness  Data Reviewed: CBC:  Recent Labs Lab 06/20/15 1755 06/21/15 0357 06/22/15 0915 06/23/15 0346  WBC 17.9* 17.7* 13.9* 8.1  NEUTROABS 16.2*  --  11.5* 7.2  HGB 14.7 12.1* 12.3* 11.9*  HCT 43.4 36.8* 37.4* 36.1*  MCV 87.0 87.4 88.2 87.6  PLT 253 188 196 224   Basic Metabolic Panel:  Recent Labs Lab 06/20/15 1757 06/21/15 0357 06/22/15 0249 06/23/15 0346  NA 136 140 139 140  K 3.8 3.9 3.5 3.6  CL 102 109 107 109  CO2 21* 23 23 23   GLUCOSE 157* 139* 106* 176*  BUN 34* 31* 28* 25*  CREATININE 2.06* 1.70* 1.37* 1.25*  CALCIUM 9.8 8.2* 8.7* 8.9   Liver Function Tests:  Recent Labs Lab 06/20/15 1757 06/23/15 0346  AST 23 17  ALT 21 21  ALKPHOS 75 58  BILITOT 0.7 0.6  PROT 7.2 5.8*  ALBUMIN 3.6 2.3*   No results for input(s): LIPASE, AMYLASE in the last 168 hours. No results for input(s): AMMONIA in the last 168 hours.  Cardiac Enzymes: No results for input(s): CKTOTAL, CKMB, CKMBINDEX, TROPONINI in the last 168 hours.  BNP (last 3 results) No results for input(s): BNP in the last 8760 hours.  CBG: No results for input(s): GLUCAP in the last 168 hours.  Recent Results (from the past 240 hour(s))  Culture, blood (routine x 2)     Status: None (Preliminary result)   Collection Time: 06/20/15  6:00 PM  Result Value Ref Range Status   Specimen Description BLOOD RIGHT ANTECUBITAL  Final   Special Requests BOTTLES DRAWN AEROBIC AND ANAEROBIC 5CC  Final   Culture NO GROWTH 3 DAYS  Final   Report Status PENDING  Incomplete  Culture, blood (routine x 2)     Status: None (Preliminary result)   Collection Time: 06/20/15  6:10 PM  Result Value Ref Range Status    Specimen Description BLOOD RIGHT HAND  Final   Special Requests BOTTLES DRAWN AEROBIC AND ANAEROBIC 5CC  Final   Culture NO GROWTH 2 DAYS  Final   Report Status PENDING  Incomplete  MRSA PCR Screening     Status: None   Collection Time: 06/20/15  9:09 PM  Result Value Ref Range Status   MRSA by PCR NEGATIVE NEGATIVE Final    Comment:        The GeneXpert MRSA Assay (FDA approved for NASAL specimens only), is one component of a comprehensive MRSA colonization surveillance program. It is not intended to diagnose MRSA infection nor to guide or monitor treatment for MRSA infections.   Urine culture     Status: None   Collection Time: 06/20/15 10:57 PM  Result Value Ref Range Status  Specimen Description URINE, RANDOM  Final   Special Requests NONE  Final   Culture NO GROWTH 2 DAYS  Final   Report Status 06/22/2015 FINAL  Final  Culture, sputum-assessment     Status: None   Collection Time: 06/21/15  5:03 AM  Result Value Ref Range Status   Specimen Description EXPECTORATED SPUTUM  Final   Special Requests NONE  Final   Sputum evaluation   Final    THIS SPECIMEN IS ACCEPTABLE. RESPIRATORY CULTURE REPORT TO FOLLOW.   Report Status 06/21/2015 FINAL  Final  Culture, respiratory (NON-Expectorated)     Status: None   Collection Time: 06/21/15  5:03 AM  Result Value Ref Range Status   Specimen Description SPUTUM  Final   Special Requests NONE  Final   Gram Stain   Final    FEW WBC PRESENT,BOTH PMN AND MONONUCLEAR FEW SQUAMOUS EPITHELIAL CELLS PRESENT FEW GRAM POSITIVE COCCI IN PAIRS Performed at [facility]    Culture   Final    NORMAL OROPHARYNGEAL FLORA Performed at [facility]    Report Status 06/23/2015 FINAL  Final     Studies: No results found.   Scheduled Meds: . allopurinol  100 mg Oral Daily  . antiseptic oral rinse  7 mL Mouth Rinse BID  . aspirin EC  81 mg Oral Daily  . azithromycin  500 mg Intravenous Q24H  . cefTRIAXone (ROCEPHIN)  IV   1 g Intravenous Q24H  . enoxaparin (LOVENOX) injection  40 mg Subcutaneous Q24H  . guaiFENesin  600 mg Oral BID  . ipratropium-albuterol  3 mL Nebulization Q6H  . methylPREDNISolone (SOLU-MEDROL) injection  60 mg Intravenous Q24H  . sodium chloride  3 mL Intravenous Q12H  . vancomycin  1,250 mg Intravenous Q24H   Continuous Infusions: . sodium chloride 75 mL/hr at 06/23/15 0750   PRN Meds: acetaminophen **OR** acetaminophen, albuterol  Time spent: 30 minutes  Author: Lynden Oxford, MD [facility] Pager: [phone] 06/23/2015 1:23 PM  If 7PM-7AM, please contact night-coverage at [email or web address], password [facility]

## 2015-06-24 LAB — BASIC METABOLIC PANEL
Anion gap: 7 (ref 5–15)
BUN: 27 mg/dL — AB (ref 6–20)
CHLORIDE: 109 mmol/L (ref 101–111)
CO2: 24 mmol/L (ref 22–32)
Calcium: 8.7 mg/dL — ABNORMAL LOW (ref 8.9–10.3)
Creatinine, Ser: 1.1 mg/dL (ref 0.61–1.24)
GFR calc Af Amer: >60 mL/min (ref >60)
GFR calc non Af Amer: 59 mL/min — ABNORMAL LOW (ref >60)
GLUCOSE: 159 mg/dL — AB (ref 65–99)
POTASSIUM: 4 mmol/L (ref 3.5–5.1)
Sodium: 140 mmol/L (ref 135–145)

## 2015-06-24 LAB — CBC
HEMATOCRIT: 34.8 % — AB (ref 39.0–52.0)
Hemoglobin: 11.9 g/dL — ABNORMAL LOW (ref 13.0–17.0)
MCH: 29.4 pg (ref 26.0–34.0)
MCHC: 34.2 g/dL (ref 30.0–36.0)
MCV: 85.9 fL (ref 78.0–100.0)
Platelets: 231 10*3/uL (ref 150–400)
RBC: 4.05 MIL/uL — ABNORMAL LOW (ref 4.22–5.81)
RDW: 13.8 % (ref 11.5–15.5)
WBC: 9.2 10*3/uL (ref 4.0–10.5)

## 2015-06-24 MED ORDER — AZITHROMYCIN 500 MG PO TABS
500.0000 mg | ORAL_TABLET | Freq: Every day | ORAL | Status: DC
  Administered 2015-06-24 – 2015-06-25 (×2): 500 mg via ORAL
  Filled 2015-06-24 (×2): qty 1

## 2015-06-24 MED ORDER — CARVEDILOL 6.25 MG PO TABS
6.2500 mg | ORAL_TABLET | Freq: Two times a day (BID) | ORAL | Status: DC
  Administered 2015-06-24 – 2015-06-26 (×4): 6.25 mg via ORAL
  Filled 2015-06-24 (×4): qty 1

## 2015-06-24 MED ORDER — ROSUVASTATIN CALCIUM 10 MG PO TABS
10.0000 mg | ORAL_TABLET | Freq: Every day | ORAL | Status: DC
Start: 2015-06-24 — End: 2015-06-26
  Administered 2015-06-24 – 2015-06-26 (×3): 10 mg via ORAL
  Filled 2015-06-24 (×3): qty 1

## 2015-06-24 MED ORDER — DEXTROSE 5 % IV SOLN
1.0000 g | Freq: Once | INTRAVENOUS | Status: DC
  Filled 2015-06-24: qty 10

## 2015-06-24 MED ORDER — PREDNISONE 20 MG PO TABS
40.0000 mg | ORAL_TABLET | Freq: Every day | ORAL | Status: DC
  Administered 2015-06-24 – 2015-06-26 (×3): 40 mg via ORAL
  Filled 2015-06-24 (×2): qty 4
  Filled 2015-06-24: qty 2

## 2015-06-24 MED ORDER — CEPHALEXIN 500 MG PO CAPS
500.0000 mg | ORAL_CAPSULE | Freq: Four times a day (QID) | ORAL | Status: AC
  Administered 2015-06-24 – 2015-06-26 (×8): 500 mg via ORAL
  Filled 2015-06-24 (×8): qty 1

## 2015-06-24 MED ORDER — DEXTROSE 5 % IV SOLN
1.0000 g | Freq: Once | INTRAVENOUS | Status: AC
  Administered 2015-06-24: 1 g via INTRAVENOUS
  Filled 2015-06-24: qty 10

## 2015-06-24 MED ORDER — FEBUXOSTAT 40 MG PO TABS
40.0000 mg | ORAL_TABLET | Freq: Every day | ORAL | Status: DC
  Administered 2015-06-24 – 2015-06-26 (×3): 40 mg via ORAL
  Filled 2015-06-24 (×5): qty 1

## 2015-06-24 NOTE — Progress Notes (Signed)
Transfer patient to 6E28 per wheelchair.  Alert and oriented.  Wife at bedside during this transfer.

## 2015-06-24 NOTE — Progress Notes (Signed)
[facility] Progress Note  Patient: Benjamin Sanders ZOX:[id number]   PCP: Ezequiel KayserPERINI,MARK A, MD DOB: 28-Jun-1927   DOA: 06/20/2015   DOS: 06/24/2015   Date of Service: the patient was seen and examined on 06/24/2015  Subjective: Patient denies any complaints of chest pain. Cough is getting better breathing is getting better. Patient's saturation increases from 90% to 96% on 4 L of nasal cannula after taking deep breaths. Nutrition: Tolerating oral diet Activity: Ambulating in the room Last BM: 06/24/2015  Assessment and Plan: 1. Pneumococcal lobar pneumonia (HCC) Sepsis Patient presented with complaints of cough ongoing for last 3 days prior to arrival, requiring oxygen with fever chills and leukocytosis and tachycardia and tachypnea. Chest x-ray shows left lower lobe pneumonia. Patient appears to have streptococcal pneumo antigen positive in the urine. Blood cultures negative, sputum culture negative, influenza PCR negative. Patient is showing slow improvement, MRSA PCR negative Switch to Keflex for 2 days to complete 7 day treatment. Continue steroid, Mucinex, flutter device,  DuoNeb. Continue flutter device as well as incentive spirometry  2. Chronic kidney disease stage III. Patient presented with mild worsening which has improved significantly currently. Avoid nephrotoxic medication.  3. Gout. Continue allopurinol.  4. Essential hypertension. Holding blood pressure medications.   DVT Prophylaxis: subcutaneous Heparin Nutrition: Regular diet Advance goals of care discussion: DNR/DNI  Brief Summary of Hospitalization:  HPI: As per the H and P dictated on admission, "patient presented with three-day history of cough which is less fatigue and weakness. No chest pain no nausea no vomiting. No diarrhea no constipation. No recent travel history no recent hospitalization. No recent procedure." Daily update, Procedures: Admitted on 06/20/2015, started on antibiotics, vancomycin added  06/21/2015. 06/23/2015 sputum culture negative, 06/24/2015 out of the stepdown unit, antibiotics transitioned to oral Consultants: None Antibiotics: Anti-infectives    Start     Dose/Rate Route Frequency Ordered Stop   06/24/15 1800  azithromycin (ZITHROMAX) tablet 500 mg     500 mg Oral Daily-1800 06/24/15 0911     06/24/15 1800  cephALEXin (KEFLEX) capsule 500 mg     500 mg Oral 4 times daily 06/24/15 1557 06/26/15 1759   06/24/15 1300  cefTRIAXone (ROCEPHIN) 1 g in dextrose 5 % 50 mL IVPB     1 g 100 mL/hr over 30 Minutes Intravenous  Once 06/24/15 1006 06/24/15 1330   06/24/15 1015  cefTRIAXone (ROCEPHIN) 1 g in dextrose 5 % 50 mL IVPB  Status:  Discontinued     1 g 100 mL/hr over 30 Minutes Intravenous  Once 06/24/15 1005 06/24/15 1006   06/22/15 1100  vancomycin (VANCOCIN) 1,250 mg in sodium chloride 0.9 % 250 mL IVPB  Status:  Discontinued     1,250 mg 166.7 mL/hr over 90 Minutes Intravenous Every 24 hours 06/21/15 0915 06/23/15 1339   06/21/15 0930  vancomycin (VANCOCIN) 1,750 mg in sodium chloride 0.9 % 500 mL IVPB     1,750 mg 250 mL/hr over 120 Minutes Intravenous  Once 06/21/15 0915 06/21/15 1206   06/20/15 1800  cefTRIAXone (ROCEPHIN) 1 g in dextrose 5 % 50 mL IVPB  Status:  Discontinued     1 g 100 mL/hr over 30 Minutes Intravenous  Once 06/20/15 1756 06/20/15 1758   06/20/15 1800  azithromycin (ZITHROMAX) 500 mg in dextrose 5 % 250 mL IVPB  Status:  Discontinued     500 mg 250 mL/hr over 60 Minutes Intravenous  Once 06/20/15 1756 06/20/15 1758   06/20/15 1800  azithromycin (ZITHROMAX) 500 mg  in dextrose 5 % 250 mL IVPB  Status:  Discontinued     500 mg 250 mL/hr over 60 Minutes Intravenous Every 24 hours 06/20/15 1758 06/24/15 0911   06/20/15 1800  cefTRIAXone (ROCEPHIN) 1 g in dextrose 5 % 50 mL IVPB  Status:  Discontinued     1 g 100 mL/hr over 30 Minutes Intravenous Every 24 hours 06/20/15 1758 06/24/15 0941      Family Communication: family was present at  bedside, at the time of interview.  Opportunity was given to ask question and all questions were answered satisfactorily.   Disposition:  Expected discharge date: 06/26/2015 Barriers to safe discharge: Hypoxia   Intake/Output Summary (Last 24 hours) at 06/24/15 1555 Last data filed at 06/24/15 0900  Gross per 24 hour  Intake   1290 ml  Output    700 ml  Net    590 ml   Filed Weights   06/20/15 1704 06/20/15 2100  Weight: 91.173 kg (201 lb) 92.08 kg (203 lb)    Objective: Physical Exam: Filed Vitals:   06/24/15 0825 06/24/15 0900 06/24/15 0948 06/24/15 1440  BP:   164/71   Pulse:  98 71   Temp:  97.9 F (36.6 C) 98 F (36.7 C)   TempSrc:  Oral Oral   Resp:  18 20   Height:      Weight:      SpO2: 94% 92% 94% 93%    General: Appear in moderate distress, no Rash; Oral Mucosa moist. Cardiovascular: S1 and S2 Present, no Murmur, no JVD Respiratory: Bilateral Air entry present and improving bilateral crepitation with rhonchi and resolved expiratory wheezes Abdomen: Bowel Sound present, Soft and no tenderness  Data Reviewed: CBC:  Recent Labs Lab 06/20/15 1755 06/21/15 0357 06/22/15 0915 06/23/15 0346 06/24/15 0341  WBC 17.9* 17.7* 13.9* 8.1 9.2  NEUTROABS 16.2*  --  11.5* 7.2  --   HGB 14.7 12.1* 12.3* 11.9* 11.9*  HCT 43.4 36.8* 37.4* 36.1* 34.8*  MCV 87.0 87.4 88.2 87.6 85.9  PLT 253 188 196 224 231   Basic Metabolic Panel:  Recent Labs Lab 06/20/15 1757 06/21/15 0357 06/22/15 0249 06/23/15 0346 06/24/15 0341  NA 136 140 139 140 140  K 3.8 3.9 3.5 3.6 4.0  CL 102 109 107 109 109  CO2 21* 23 23 23 24   GLUCOSE 157* 139* 106* 176* 159*  BUN 34* 31* 28* 25* 27*  CREATININE 2.06* 1.70* 1.37* 1.25* 1.10  CALCIUM 9.8 8.2* 8.7* 8.9 8.7*   Liver Function Tests:  Recent Labs Lab 06/20/15 1757 06/23/15 0346  AST 23 17  ALT 21 21  ALKPHOS 75 58  BILITOT 0.7 0.6  PROT 7.2 5.8*  ALBUMIN 3.6 2.3*   No results for input(s): LIPASE, AMYLASE in the  last 168 hours. No results for input(s): AMMONIA in the last 168 hours.  Cardiac Enzymes: No results for input(s): CKTOTAL, CKMB, CKMBINDEX, TROPONINI in the last 168 hours.  BNP (last 3 results) No results for input(s): BNP in the last 8760 hours.  CBG: No results for input(s): GLUCAP in the last 168 hours.  Recent Results (from the past 240 hour(s))  Culture, blood (routine x 2)     Status: None (Preliminary result)   Collection Time: 06/20/15  6:00 PM  Result Value Ref Range Status   Specimen Description BLOOD RIGHT ANTECUBITAL  Final   Special Requests BOTTLES DRAWN AEROBIC AND ANAEROBIC 5CC  Final   Culture NO GROWTH 4 DAYS  Final  Report Status PENDING  Incomplete  Culture, blood (routine x 2)     Status: None (Preliminary result)   Collection Time: 06/20/15  6:10 PM  Result Value Ref Range Status   Specimen Description BLOOD RIGHT HAND  Final   Special Requests BOTTLES DRAWN AEROBIC AND ANAEROBIC 5CC  Final   Culture NO GROWTH 3 DAYS  Final   Report Status PENDING  Incomplete  MRSA PCR Screening     Status: None   Collection Time: 06/20/15  9:09 PM  Result Value Ref Range Status   MRSA by PCR NEGATIVE NEGATIVE Final    Comment:        The GeneXpert MRSA Assay (FDA approved for NASAL specimens only), is one component of a comprehensive MRSA colonization surveillance program. It is not intended to diagnose MRSA infection nor to guide or monitor treatment for MRSA infections.   Urine culture     Status: None   Collection Time: 06/20/15 10:57 PM  Result Value Ref Range Status   Specimen Description URINE, RANDOM  Final   Special Requests NONE  Final   Culture NO GROWTH 2 DAYS  Final   Report Status 06/22/2015 FINAL  Final  Culture, sputum-assessment     Status: None   Collection Time: 06/21/15  5:03 AM  Result Value Ref Range Status   Specimen Description EXPECTORATED SPUTUM  Final   Special Requests NONE  Final   Sputum evaluation   Final    THIS SPECIMEN  IS ACCEPTABLE. RESPIRATORY CULTURE REPORT TO FOLLOW.   Report Status 06/21/2015 FINAL  Final  Culture, respiratory (NON-Expectorated)     Status: None   Collection Time: 06/21/15  5:03 AM  Result Value Ref Range Status   Specimen Description SPUTUM  Final   Special Requests NONE  Final   Gram Stain   Final    FEW WBC PRESENT,BOTH PMN AND MONONUCLEAR FEW SQUAMOUS EPITHELIAL CELLS PRESENT FEW GRAM POSITIVE COCCI IN PAIRS Performed at [facility]    Culture   Final    NORMAL OROPHARYNGEAL FLORA Performed at [facility]    Report Status 06/23/2015 FINAL  Final     Studies: No results found.   Scheduled Meds: . antiseptic oral rinse  7 mL Mouth Rinse BID  . aspirin EC  81 mg Oral Daily  . azithromycin  500 mg Oral q1800  . carvedilol  6.25 mg Oral BID WC  . enoxaparin (LOVENOX) injection  40 mg Subcutaneous Q24H  . febuxostat  40 mg Oral Daily  . guaiFENesin  600 mg Oral BID  . ipratropium-albuterol  3 mL Nebulization Q6H  . predniSONE  40 mg Oral Q breakfast  . rosuvastatin  10 mg Oral Daily  . sodium chloride  3 mL Intravenous Q12H   Continuous Infusions:   PRN Meds: acetaminophen **OR** acetaminophen, albuterol  Time spent: 30 minutes  Author: Lynden Oxford, MD [facility] Pager: [phone] 06/24/2015 3:55 PM  If 7PM-7AM, please contact night-coverage at [email or web address], password [facility]

## 2015-06-24 NOTE — Progress Notes (Signed)
Physical Therapy Treatment Patient Details Name: Benjamin DoomsWilliam Sanders MRN: [id number] DOB: Nov 24, 1927 Today's Date: 06/24/2015    History of Present Illness Patient is a 82 y/o male with hx of HTN who presents with acute cough and hemopytsis. In ED, pt febrile to 101.54F, tachycardic, tachypneic, and hypoxic to 88% on RA with leukocytosis and an elevated lactate.CXR- left base opacity. Code sepsis was called.    PT Comments    Pt functioning near baseline however remains at a moderate falls risk as indicated by score of 16 on DGI. Pt's SpO2 dec to 80% on RA during ambulation. Suspect pt may need O2 upon d/c. Pt to con't to benefit from HHPT to address balance impairment and management of O2 at home. Acute PT to con't to follow patient to progress indep with mobility.  SATURATION QUALIFICATIONS: (This note is used to comply with regulatory documentation for home oxygen)  Patient Saturations on Room Air at Rest =88%  Patient Saturations on Room Air while Ambulating = 80%  Patient Saturations on 4 Liters of oxygen while Ambulating = 92%  Please briefly explain why patient needs home oxygen: unable to maintain SpO2 >88% on RA.   Follow Up Recommendations  Home health PT;Supervision for mobility/OOB     Equipment Recommendations  Rolling walker with 5" wheels    Recommendations for Other Services  (Home O2 Assessment)     Precautions / Restrictions Precautions Precautions: Fall Precaution Comments: monitor O2, with activitiy drops to low 80s Restrictions Weight Bearing Restrictions: No    Mobility  Bed Mobility Overal bed mobility: Modified Independent             General bed mobility comments: used bed rail and HOB was elevated  Transfers Overall transfer level: Modified independent Equipment used: None Transfers: Sit to/from Stand Sit to Stand: Modified independent (Device/Increase time)         General transfer comment: pt demo'd good  technique  Ambulation/Gait Ambulation/Gait assistance: Min guard (min A progressing to CGA) Ambulation Distance (Feet): 350 Feet Assistive device: None Gait Pattern/deviations: Step-through pattern;Decreased stride length (B stomping vs foot drop) Gait velocity: decreased   General Gait Details: Required decreased supervision with increased distance ambulated ~75 ft on RA but dropped to 82% and added SpO2. 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 however pt stats he has normal sensatio and likes to stomp his feet   Stairs Stairs: Yes Stairs assistance: Min assist Stair Management: One rail Right;Forwards;Alternating pattern (step to trial one and alternating trial 2) Number of Stairs: 3 General stair comments: advised pt not to descend backwards, completed 2 sets of 3 stairs to mimic home set up  Wheelchair Mobility    Modified Rankin (Stroke Patients Only)       Balance Overall balance assessment: Modified Independent Sitting-balance support: No upper extremity supported;Feet unsupported Sitting balance-Leahy Scale: Good     Standing balance support: During functional activity Standing balance-Leahy Scale: Fair Standing balance comment: Performed DGI pt was able to complete figure 8 and 180 degress turns with CGA. Required verbal cues to step over obstacles                    Cognition Arousal/Alertness: Awake/alert Behavior [facility] for tasks assessed/performed Overall Cognitive Status: Within Functional Limits for tasks assessed                      Exercises      General  Comments General comments (skin integrity, edema, etc.): B LE swelling , pt denies pain      Pertinent Vitals/Pain Pain Assessment: No/denies pain    Home Living                      Prior Function            PT Goals (current goals can now be found in the care plan section) Acute Rehab PT Goals Patient Stated  Goal: to return home  Progress towards PT goals: Progressing toward goals    Frequency  Min 3X/week    PT Plan Current plan remains appropriate    Co-evaluation             End of Session Equipment Utilized During Treatment: Gait belt;Oxygen Activity Tolerance: Patient tolerated treatment well (drop in O@ to 82% ambulating on RA. Improved to WNL with oxy) Patient left: in chair;with family/visitor present;with call bell/phone within reach     Time: 1610-9604 PT Time Calculation (min) (ACUTE ONLY): 28 min  Charges:  $Gait Training: 8-22 mins $Neuromuscular Re-education: 8-22 mins                    G Codes:      Marcene Brawn 06/24/2015, 4:45 PM   Lewis Shock, PT, DPT Pager #: [phone] Office #: [phone]

## 2015-06-24 NOTE — Plan of Care (Signed)
Problem: Education: Goal: Knowledge of Why General Education information/materials will improve Outcome: Progressing Discussed preparing for discharge home and disease process with patient and spouse

## 2015-06-24 NOTE — Care Management Important Message (Signed)
Important Message  Patient Details  Name: Alinda DoomsWilliam Halls MRN: [id number] Date of Birth: [birth year]   Medicare Important Message Given:  Yes    Rayvon CharSTUTTS, Arlicia Paquette G 06/24/2015, 12:26 PMImportant Message  Patient Details  Name: Alinda DoomsWilliam Lefkowitz MRN: [id number] Date of Birth: [birth year]   Medicare Important Message Given:  Yes    Voncille Simm G 06/24/2015, 12:26 PM

## 2015-06-24 NOTE — Progress Notes (Signed)
Occupational Therapy Treatment Patient Details Name: Benjamin Sanders MRN: [id number] DOB: [birth year] Today's Date: 06/24/2015    History of present illness Patient is a 82 y/o male with hx of HTN who presents with acute cough and hemopytsis. In ED, pt febrile to 101.39F, tachycardic, tachypneic, and hypoxic to 88% on RA with leukocytosis and an elevated lactate.CXR- left base opacity. Code sepsis was called.   OT comments  This 82 yo male admitted with above presents to acute OT having met all of his goals with education today on purse lipped breathing and energy conservation strategies. Acute OT will D/C pt.  Follow Up Recommendations  No OT follow up    Equipment Recommendations  None recommended by OT       Precautions / Restrictions Precautions Precaution Comments: monitor O2 sats (dropped to 84-85% on RA) when up ambulating Restrictions Weight Bearing Restrictions: No       Mobility Bed Mobility               General bed mobility comments: Sitting in chair upon OT arrival.   Transfers Overall transfer level: Modified independent   Transfers: Sit to/from Stand Sit to Stand: Modified independent (Device/Increase time)         General transfer comment: Ambulated with AD 100 feet         ADL Overall ADL's : Modified independent                                       General ADL Comments: Issued pt energy conservation handout with some basic areas highlighted                Cognition   Behavior During Therapy: [facility] for tasks assessed/performed Overall Cognitive Status: Within Functional Limits for tasks assessed                                    Pertinent Vitals/ Pain       Pain Assessment: No/denies pain            Progress Toward Goals  OT Goals(current goals can now be found in the care plan section)  Progress towards OT goals: Goals met/education completed, patient discharged from [place] Discharge plan  remains appropriate       End of Session Equipment Utilized During Treatment:  (on RA for most of session --reapplied at 6 liters he was on when I entered room due to drop in O2 sats to 84-85% on RA)--pt reports he does not feel SOB and he does not sound SOB)   Activity Tolerance Patient tolerated treatment well   Patient Left in chair;with call bell/phone within reach   Nurse Communication  (have asked PT to check for O2 qualification)        Time: 2595-6387 OT Time Calculation (min): 22 min  Charges: OT General Charges $OT Visit: 1 Procedure OT Treatments $Self Care/Home Management : 8-22 mins  Almon Register [phone] 06/24/2015, 12:10 PM

## 2015-06-24 NOTE — Progress Notes (Deleted)
Discontinued droplette precautions per Carol with Infection Prevention.  

## 2015-06-24 NOTE — Progress Notes (Signed)
Patient arrived on unit via wheelchair from [facility].  Wife at bedside.  Telemetry placed per MD order.  CMT notified.

## 2015-06-25 DIAGNOSIS — R600 Localized edema: Secondary | ICD-10-CM

## 2015-06-25 LAB — CULTURE, BLOOD (ROUTINE X 2): CULTURE: NO GROWTH

## 2015-06-25 MED ORDER — HYDRALAZINE HCL 25 MG PO TABS
25.0000 mg | ORAL_TABLET | Freq: Three times a day (TID) | ORAL | Status: DC
Start: 2015-06-25 — End: 2015-06-25
  Administered 2015-06-25: 25 mg via ORAL
  Filled 2015-06-25: qty 1

## 2015-06-25 MED ORDER — HYDRALAZINE HCL 50 MG PO TABS
50.0000 mg | ORAL_TABLET | Freq: Three times a day (TID) | ORAL | Status: DC
  Administered 2015-06-25 – 2015-06-26 (×4): 50 mg via ORAL
  Filled 2015-06-25 (×4): qty 1

## 2015-06-25 MED ORDER — ALLOPURINOL 100 MG PO TABS
100.0000 mg | ORAL_TABLET | Freq: Every day | ORAL | Status: DC
  Administered 2015-06-25 – 2015-06-26 (×2): 100 mg via ORAL
  Filled 2015-06-25 (×2): qty 1

## 2015-06-25 MED ORDER — FUROSEMIDE 10 MG/ML IJ SOLN
20.0000 mg | Freq: Once | INTRAMUSCULAR | Status: AC
  Administered 2015-06-25: 20 mg via INTRAVENOUS
  Filled 2015-06-25: qty 2

## 2015-06-25 NOTE — Progress Notes (Signed)
[facility] Progress Note  Patient: Benjamin Sanders RUE:[id number]   PCP: Ezequiel Kayser, MD DOB: 05/22/1928   DOA: 06/20/2015   DOS: 06/25/2015   Date of Service: the patient was seen and examined on 06/25/2015  Subjective: patient improves significantly and he is now on 1 L of nasal cannula, has some leg swelling. Nutrition: Tolerating oral diet Activity: Ambulating in the room Last BM: 06/24/2015  Assessment and Plan: 1. Pneumococcal lobar pneumonia (HCC) Sepsis Patient presented with complaints of cough ongoing for last 3 days prior to arrival, requiring oxygen with fever chills and leukocytosis and tachycardia and tachypnea. Chest x-ray shows left lower lobe pneumonia. Patient appears to have streptococcal pneumo antigen positive in the urine. Blood cultures negative, sputum culture negative, influenza PCR negative. Patient is showing slow improvement, MRSA PCR negative Switch to Keflex for 2 days to complete 7 day treatment. Continue steroid, Mucinex, flutter device,  DuoNeb. Continue flutter device as well as incentive spirometry Wean oxygen prior to discharge.  2. Chronic kidney disease stage III. Patient presented with mild worsening which has improved significantly currently. Avoid nephrotoxic medication.  3. Gout. Continue home medication patient will need to discuss with PCP regarding discontinuing one of the medication.  4. Essential hypertension. Blood pressure elevated. Avoiding amlodipine due to pedal edema  Avoiding HCTZ at present. Using hydralazine. Continue coreg.  5. Pedal edema Use lasix and ted stocking.  DVT Prophylaxis: subcutaneous Heparin Nutrition: Regular diet Advance goals of care discussion: DNR/DNI  Brief Summary of Hospitalization:  HPI: As per the H and P dictated on admission, "patient presented with three-day history of cough which is less fatigue and weakness. No chest pain no nausea no vomiting. No diarrhea no constipation. No recent  travel history no recent hospitalization. No recent procedure." Daily update, Procedures: Admitted on 06/20/2015, started on antibiotics, vancomycin added 06/21/2015. 06/23/2015 sputum culture negative, 06/24/2015 out of the stepdown unit, antibiotics transitioned to oral Consultants: None Antibiotics: Anti-infectives    Start     Dose/Rate Route Frequency Ordered Stop   06/24/15 1800  azithromycin (ZITHROMAX) tablet 500 mg     500 mg Oral Daily-1800 06/24/15 0911     06/24/15 1800  cephALEXin (KEFLEX) capsule 500 mg     500 mg Oral 4 times daily 06/24/15 1557 06/26/15 1759   06/24/15 1300  cefTRIAXone (ROCEPHIN) 1 g in dextrose 5 % 50 mL IVPB     1 g 100 mL/hr over 30 Minutes Intravenous  Once 06/24/15 1006 06/24/15 1330   06/24/15 1015  cefTRIAXone (ROCEPHIN) 1 g in dextrose 5 % 50 mL IVPB  Status:  Discontinued     1 g 100 mL/hr over 30 Minutes Intravenous  Once 06/24/15 1005 06/24/15 1006   06/22/15 1100  vancomycin (VANCOCIN) 1,250 mg in sodium chloride 0.9 % 250 mL IVPB  Status:  Discontinued     1,250 mg 166.7 mL/hr over 90 Minutes Intravenous Every 24 hours 06/21/15 0915 06/23/15 1339   06/21/15 0930  vancomycin (VANCOCIN) 1,750 mg in sodium chloride 0.9 % 500 mL IVPB     1,750 mg 250 mL/hr over 120 Minutes Intravenous  Once 06/21/15 0915 06/21/15 1206   06/20/15 1800  cefTRIAXone (ROCEPHIN) 1 g in dextrose 5 % 50 mL IVPB  Status:  Discontinued     1 g 100 mL/hr over 30 Minutes Intravenous  Once 06/20/15 1756 06/20/15 1758   06/20/15 1800  azithromycin (ZITHROMAX) 500 mg in dextrose 5 % 250 mL IVPB  Status:  Discontinued  500 mg 250 mL/hr over 60 Minutes Intravenous  Once 06/20/15 1756 06/20/15 1758   06/20/15 1800  azithromycin (ZITHROMAX) 500 mg in dextrose 5 % 250 mL IVPB  Status:  Discontinued     500 mg 250 mL/hr over 60 Minutes Intravenous Every 24 hours 06/20/15 1758 06/24/15 0911   06/20/15 1800  cefTRIAXone (ROCEPHIN) 1 g in dextrose 5 % 50 mL IVPB  Status:   Discontinued     1 g 100 mL/hr over 30 Minutes Intravenous Every 24 hours 06/20/15 1758 06/24/15 0941      Family Communication: family was present at bedside, at the time of interview.  Opportunity was given to ask question and all questions were answered satisfactorily.   Disposition:  Expected discharge date: 06/26/2015 Barriers to safe discharge: Hypoxia   Intake/Output Summary (Last 24 hours) at 06/25/15 1809 Last data filed at 06/25/15 1330  Gross per 24 hour  Intake    600 ml  Output    800 ml  Net   -200 ml   Filed Weights   06/20/15 1704 06/20/15 2100 06/24/15 2204  Weight: 91.173 kg (201 lb) 92.08 kg (203 lb) 94.2 kg (207 lb 10.8 oz)    Objective: Physical Exam: Filed Vitals:   06/25/15 0827 06/25/15 0935 06/25/15 1221 06/25/15 1444  BP: 160/94 152/89 172/89 171/80  Pulse:  77  69  Temp:  97.7 F (36.5 C)  97.6 F (36.4 C)  TempSrc:  Oral  Oral  Resp:  18  18  Height:      Weight:      SpO2: 94% 94%  93%    General: Appear in moderate distress, no Rash; Oral Mucosa moist. Cardiovascular: S1 and S2 Present, no Murmur, no JVD Respiratory: Bilateral Air entry present and improving bilateral crepitation with rhonchi and resolved expiratory wheezes Abdomen: Bowel Sound present, Soft and no tenderness Bilateral pedal edema no calf tenderness  Data Reviewed: CBC:  Recent Labs Lab 06/20/15 1755 06/21/15 0357 06/22/15 0915 06/23/15 0346 06/24/15 0341  WBC 17.9* 17.7* 13.9* 8.1 9.2  NEUTROABS 16.2*  --  11.5* 7.2  --   HGB 14.7 12.1* 12.3* 11.9* 11.9*  HCT 43.4 36.8* 37.4* 36.1* 34.8*  MCV 87.0 87.4 88.2 87.6 85.9  PLT 253 188 196 224 231   Basic Metabolic Panel:  Recent Labs Lab 06/20/15 1757 06/21/15 0357 06/22/15 0249 06/23/15 0346 06/24/15 0341  NA 136 140 139 140 140  K 3.8 3.9 3.5 3.6 4.0  CL 102 109 107 109 109  CO2 21* 23 23 23 24   GLUCOSE 157* 139* 106* 176* 159*  BUN 34* 31* 28* 25* 27*  CREATININE 2.06* 1.70* 1.37* 1.25* 1.10    CALCIUM 9.8 8.2* 8.7* 8.9 8.7*   Liver Function Tests:  Recent Labs Lab 06/20/15 1757 06/23/15 0346  AST 23 17  ALT 21 21  ALKPHOS 75 58  BILITOT 0.7 0.6  PROT 7.2 5.8*  ALBUMIN 3.6 2.3*   No results for input(s): LIPASE, AMYLASE in the last 168 hours. No results for input(s): AMMONIA in the last 168 hours.  Cardiac Enzymes: No results for input(s): CKTOTAL, CKMB, CKMBINDEX, TROPONINI in the last 168 hours.  BNP (last 3 results) No results for input(s): BNP in the last 8760 hours.  CBG: No results for input(s): GLUCAP in the last 168 hours.  Recent Results (from the past 240 hour(s))  Culture, blood (routine x 2)     Status: None   Collection Time: 06/20/15  6:00 PM  Result Value Ref Range Status   Specimen Description BLOOD RIGHT ANTECUBITAL  Final   Special Requests BOTTLES DRAWN AEROBIC AND ANAEROBIC 5CC  Final   Culture NO GROWTH 5 DAYS  Final   Report Status 06/25/2015 FINAL  Final  Culture, blood (routine x 2)     Status: None (Preliminary result)   Collection Time: 06/20/15  6:10 PM  Result Value Ref Range Status   Specimen Description BLOOD RIGHT HAND  Final   Special Requests BOTTLES DRAWN AEROBIC AND ANAEROBIC 5CC  Final   Culture NO GROWTH 4 DAYS  Final   Report Status PENDING  Incomplete  MRSA PCR Screening     Status: None   Collection Time: 06/20/15  9:09 PM  Result Value Ref Range Status   MRSA by PCR NEGATIVE NEGATIVE Final    Comment:        The GeneXpert MRSA Assay (FDA approved for NASAL specimens only), is one component of a comprehensive MRSA colonization surveillance program. It is not intended to diagnose MRSA infection nor to guide or monitor treatment for MRSA infections.   Urine culture     Status: None   Collection Time: 06/20/15 10:57 PM  Result Value Ref Range Status   Specimen Description URINE, RANDOM  Final   Special Requests NONE  Final   Culture NO GROWTH 2 DAYS  Final   Report Status 06/22/2015 FINAL  Final  Culture,  sputum-assessment     Status: None   Collection Time: 06/21/15  5:03 AM  Result Value Ref Range Status   Specimen Description EXPECTORATED SPUTUM  Final   Special Requests NONE  Final   Sputum evaluation   Final    THIS SPECIMEN IS ACCEPTABLE. RESPIRATORY CULTURE REPORT TO FOLLOW.   Report Status 06/21/2015 FINAL  Final  Culture, respiratory (NON-Expectorated)     Status: None   Collection Time: 06/21/15  5:03 AM  Result Value Ref Range Status   Specimen Description SPUTUM  Final   Special Requests NONE  Final   Gram Stain   Final    FEW WBC PRESENT,BOTH PMN AND MONONUCLEAR FEW SQUAMOUS EPITHELIAL CELLS PRESENT FEW GRAM POSITIVE COCCI IN PAIRS Performed at [facility]    Culture   Final    NORMAL OROPHARYNGEAL FLORA Performed at [facility]    Report Status 06/23/2015 FINAL  Final     Studies: No results found.   Scheduled Meds: . allopurinol  100 mg Oral Daily  . antiseptic oral rinse  7 mL Mouth Rinse BID  . aspirin EC  81 mg Oral Daily  . azithromycin  500 mg Oral q1800  . carvedilol  6.25 mg Oral BID WC  . cephALEXin  500 mg Oral QID  . enoxaparin (LOVENOX) injection  40 mg Subcutaneous Q24H  . febuxostat  40 mg Oral Daily  . guaiFENesin  600 mg Oral BID  . hydrALAZINE  50 mg Oral 3 times per day  . predniSONE  40 mg Oral Q breakfast  . rosuvastatin  10 mg Oral Daily  . sodium chloride  3 mL Intravenous Q12H   Continuous Infusions:   PRN Meds: acetaminophen **OR** acetaminophen, albuterol  Time spent: 30 minutes  Author: Lynden Oxford, MD [facility] Pager: [phone] 06/25/2015 6:09 PM  If 7PM-7AM, please contact night-coverage at [email or web address], password [facility]

## 2015-06-25 NOTE — Care Management Note (Signed)
Case Management Note  Patient Details  Name: Benjamin Sanders MRN: [id number] Date of Birth: 1927-07-24  Subjective/Objective:      CM following for progression and d/c planning.              Action/Plan: 06/25/2015 Met with pt and wife, re HHPT and possible oxygen needs. Per pt her does not feel that he needs HHPT, as he is progresing independently, going ot the bathroom etc and he states that he and his wife have walkers and 3:1 commodes if needed. If home oxygen is ordered this CM will ask [facility] to provide this service. This was explained to pt and wife, use of tank, concentrator at home etc.  Pt is very politely declining HHPT.   Expected Discharge Date:  06/26/15               Expected Discharge Plan:  Home/Self Care  In-House Referral:  NA  Discharge planning Services  CM Consult  Post Acute Care Choice:  Durable Medical Equipment Choice offered to:  Patient  DME Arranged:  Oxygen DME Agency:     HH Arranged:    HH Agency:     Status of Service:  In process, will continue to follow  Medicare Important Message Given:  Yes Date Medicare IM Given:    Medicare IM give by:    Date Additional Medicare IM Given:    Additional Medicare Important Message give by:     If discussed at [place] of Stay Meetings, dates discussed:    Additional Comments:  Adron Bene, RN 06/25/2015, 10:51 AM

## 2015-06-26 ENCOUNTER — Telehealth: Payer: Self-pay | Admitting: *Deleted

## 2015-06-26 DIAGNOSIS — N183 Chronic kidney disease, stage 3 (moderate): Secondary | ICD-10-CM

## 2015-06-26 DIAGNOSIS — I10 Essential (primary) hypertension: Secondary | ICD-10-CM

## 2015-06-26 DIAGNOSIS — J13 Pneumonia due to Streptococcus pneumoniae: Secondary | ICD-10-CM

## 2015-06-26 LAB — BASIC METABOLIC PANEL
ANION GAP: 8 (ref 5–15)
BUN: 27 mg/dL — ABNORMAL HIGH (ref 6–20)
CALCIUM: 8.7 mg/dL — AB (ref 8.9–10.3)
CO2: 28 mmol/L (ref 22–32)
Chloride: 103 mmol/L (ref 101–111)
Creatinine, Ser: 1.15 mg/dL (ref 0.61–1.24)
GFR, EST NON AFRICAN AMERICAN: 56 mL/min — AB (ref >60)
Glucose, Bld: 97 mg/dL (ref 65–99)
Potassium: 4 mmol/L (ref 3.5–5.1)
SODIUM: 139 mmol/L (ref 135–145)

## 2015-06-26 LAB — CBC
HCT: 38.6 % — ABNORMAL LOW (ref 39.0–52.0)
Hemoglobin: 12.6 g/dL — ABNORMAL LOW (ref 13.0–17.0)
MCH: 28.7 pg (ref 26.0–34.0)
MCHC: 32.6 g/dL (ref 30.0–36.0)
MCV: 87.9 fL (ref 78.0–100.0)
PLATELETS: 293 10*3/uL (ref 150–400)
RBC: 4.39 MIL/uL (ref 4.22–5.81)
RDW: 13.8 % (ref 11.5–15.5)
WBC: 8.7 10*3/uL (ref 4.0–10.5)

## 2015-06-26 LAB — CULTURE, BLOOD (ROUTINE X 2): Culture: NO GROWTH

## 2015-06-26 LAB — MAGNESIUM: MAGNESIUM: 2 mg/dL (ref 1.7–2.4)

## 2015-06-26 MED ORDER — GUAIFENESIN-DM 100-10 MG/5ML PO SYRP
5.0000 mL | ORAL_SOLUTION | ORAL | Status: DC | PRN
  Filled 2015-06-26: qty 5

## 2015-06-26 MED ORDER — PREDNISONE 10 MG PO TABS: ORAL_TABLET | ORAL | Status: DC

## 2015-06-26 NOTE — Progress Notes (Signed)
SATURATION QUALIFICATIONS: (This note is used to comply with regulatory documentation for home oxygen)  Patient Saturations on Room Air at Rest =  90%  Patient Saturations on Room Air while Ambulating = 86 %  Patient Saturations on  2 Liters of oxygen while Ambulating = 92% @2lpm   Please briefly explain why patient needs home oxygen:

## 2015-06-26 NOTE — Telephone Encounter (Signed)
Pharmacy called stating pt thought he was prescribed an antibiotic.  [facility]nisone.  Relayed information to pharmacy.

## 2015-06-26 NOTE — Discharge Summary (Addendum)
Physician Discharge Summary  Alinda DoomsWilliam Blount WGN:[id number] DOB: January 10, 1928 DOA: 06/20/2015  PCP: Ezequiel KayserPERINI,MARK A, MD  Admit date: 06/20/2015 Discharge date: 06/26/2015  Time spent: 55 minutes  Recommendations for Outpatient Follow-up:  - recheck pulse ox in 2 wks to see if he can be weaned off- will need CXR in 2 wks to document clearing  Discharge Condition: stable    Discharge Diagnoses:  Principal Problem:   Pneumococcal lobar pneumonia (HCC) Active Problems:   Essential hypertension   Gout   CKD (chronic kidney disease), stage III   Sepsis (HCC)   Pedal edema   History of present illness:  82 y/o patient with HTN, CKD 3, Gout who presents with sepsis secondary to left mid and lower lobe CAP. Presenting complaint was hemoptysis. Noted to be febrile and short of breath as well. Temp 101.3. Pulse ox 88% on room air.   Hospital Course:  CAP/ Sepsis - U strep antigen + - Blood Cx negative, sputum culture has grown normal flora - treated with 7 day course of Rocephin/ Zithromax and then Keflex - symptoms mostly resolved but hypoxic with ambulation- will go home with 2 L O - recheck pulse ox in 2 wks to see if he can be weaned off- will need CXR in 2 wks to document clearing  AKI on CKD 3  - Cr improved to 1.15  Gout - cont Allopurinol  Essential HTN - resume Coreg, HCTZ, Lisinopril  and Amlodipine    Consultations:  none  Discharge Exam: Filed Weights   06/20/15 2100 06/24/15 2204 06/25/15 2116  Weight: 92.08 kg (203 lb) 94.2 kg (207 lb 10.8 oz) 94.3 kg (207 lb 14.3 oz)   Filed Vitals:   06/26/15 0513 06/26/15 1000  BP: 169/82 163/87  Pulse: 58 64  Temp: 98.4 F (36.9 C) 98.2 F (36.8 C)  Resp: 20 18    General: AAO x 3, no distress Cardiovascular: RRR, no murmurs  Respiratory: clear to auscultation bilaterally GI: soft, non-tender, non-distended, bowel sound positive  Discharge Instructions You were cared for by a hospitalist during your hospital stay.  If you have any questions about your discharge medications or the care you received while you were in the hospital after you are discharged, you can call the unit and asked to speak with the hospitalist on call if the hospitalist that took care of you is not available. Once you are discharged, your primary care physician will handle any further medical issues. Please note that NO REFILLS for any discharge medications will be authorized once you are discharged, as it is imperative that you return to your primary care physician (or establish a relationship with a primary care physician if you do not have one) for your aftercare needs so that they can reassess your need for medications and monitor your lab values.      Discharge Instructions    Diet - low sodium heart healthy    Complete by:  As directed      Increase activity slowly    Complete by:  As directed             Medication List    TAKE these medications        acetaminophen 500 MG tablet  Commonly known as:  TYLENOL  Take 1,000 mg by mouth every 6 (six) hours as needed for mild pain.     allopurinol 100 MG tablet  Commonly known as:  ZYLOPRIM  Take 100 mg by mouth daily.  amLODipine 10 MG tablet  Commonly known as:  NORVASC  Take 10 mg by mouth daily.     aspirin EC 81 MG tablet  Take 81 mg by mouth daily. Reported on 06/20/2015     carvedilol 6.25 MG tablet  Commonly known as:  COREG  Take 6.25 mg by mouth 2 (two) times daily with a meal.     hydrochlorothiazide 12.5 MG capsule  Commonly known as:  MICROZIDE  Take 12.5 mg by mouth daily.     predniSONE 10 MG tablet  Commonly known as:  DELTASONE  Take 20 mg tomorrow, 10 mg the next day and 5 mg the last day     rosuvastatin 10 MG tablet  Commonly known as:  CRESTOR  Take 10 mg by mouth daily.     ULORIC 80 MG Tabs  Generic drug:  Febuxostat  Take 40 mg by mouth daily.       No Known Allergies    The results of significant diagnostics from this  hospitalization (including imaging, microbiology, ancillary and laboratory) are listed below for reference.    Significant Diagnostic Studies: Dg Chest Port 1 View  06/21/2015  CLINICAL DATA:  Pneumococcal pneumonia. EXAM: PORTABLE CHEST 1 VIEW COMPARISON:  06/20/2015. FINDINGS: Cardiomegaly with normal pulmonary vascularity. Left mid lung and left lower lobe infiltrate consistent pneumonia. Small left pleural effusion. No acute bony abnormality . IMPRESSION: 1. Progressive lead mid lung and left lower lobe infiltrate consistent with pneumonia. Small left pleural effusion. 2.  Cardiomegaly.  No pulmonary venous congestion. Electronically Signed   By: Maisie Fus  Register   On: 06/21/2015 08:14   Dg Chest Portable 1 View  06/20/2015  CLINICAL DATA:  Cough and shortness of breath since yesterday. EXAM: PORTABLE CHEST 1 VIEW COMPARISON:  October 05, 2013 FINDINGS: The heart size and mediastinal contours are within normal limits. There is a consolidation of left lung base with small left pleural effusion. There is no pulmonary edema. The visualized skeletal structures are unremarkable. IMPRESSION: Left lung base pneumonia with small left pleural effusion. Electronically Signed   By: Sherian Rein M.D.   On: 06/20/2015 18:05    Microbiology: Recent Results (from the past 240 hour(s))  Culture, blood (routine x 2)     Status: None   Collection Time: 06/20/15  6:00 PM  Result Value Ref Range Status   Specimen Description BLOOD RIGHT ANTECUBITAL  Final   Special Requests BOTTLES DRAWN AEROBIC AND ANAEROBIC 5CC  Final   Culture NO GROWTH 5 DAYS  Final   Report Status 06/25/2015 FINAL  Final  Culture, blood (routine x 2)     Status: None   Collection Time: 06/20/15  6:10 PM  Result Value Ref Range Status   Specimen Description BLOOD RIGHT HAND  Final   Special Requests BOTTLES DRAWN AEROBIC AND ANAEROBIC 5CC  Final   Culture NO GROWTH 5 DAYS  Final   Report Status 06/26/2015 FINAL  Final  MRSA PCR Screening      Status: None   Collection Time: 06/20/15  9:09 PM  Result Value Ref Range Status   MRSA by PCR NEGATIVE NEGATIVE Final    Comment:        The GeneXpert MRSA Assay (FDA approved for NASAL specimens only), is one component of a comprehensive MRSA colonization surveillance program. It is not intended to diagnose MRSA infection nor to guide or monitor treatment for MRSA infections.   Urine culture     Status: None   Collection  Time: 06/20/15 10:57 PM  Result Value Ref Range Status   Specimen Description URINE, RANDOM  Final   Special Requests NONE  Final   Culture NO GROWTH 2 DAYS  Final   Report Status 06/22/2015 FINAL  Final  Culture, sputum-assessment     Status: None   Collection Time: 06/21/15  5:03 AM  Result Value Ref Range Status   Specimen Description EXPECTORATED SPUTUM  Final   Special Requests NONE  Final   Sputum evaluation   Final    THIS SPECIMEN IS ACCEPTABLE. RESPIRATORY CULTURE REPORT TO FOLLOW.   Report Status 06/21/2015 FINAL  Final  Culture, respiratory (NON-Expectorated)     Status: None   Collection Time: 06/21/15  5:03 AM  Result Value Ref Range Status   Specimen Description SPUTUM  Final   Special Requests NONE  Final   Gram Stain   Final    FEW WBC PRESENT,BOTH PMN AND MONONUCLEAR FEW SQUAMOUS EPITHELIAL CELLS PRESENT FEW GRAM POSITIVE COCCI IN PAIRS Performed at [facility]    Culture   Final    NORMAL OROPHARYNGEAL FLORA Performed at [facility]    Report Status 06/23/2015 FINAL  Final     Labs: Basic Metabolic Panel:  Recent Labs Lab 06/21/15 0357 06/22/15 0249 06/23/15 0346 06/24/15 0341 06/26/15 0508  NA 140 139 140 140 139  K 3.9 3.5 3.6 4.0 4.0  CL 109 107 109 109 103  CO2 23 23 23 24 28   GLUCOSE 139* 106* 176* 159* 97  BUN 31* 28* 25* 27* 27*  CREATININE 1.70* 1.37* 1.25* 1.10 1.15  CALCIUM 8.2* 8.7* 8.9 8.7* 8.7*  MG  --   --   --   --  2.0   Liver Function Tests:  Recent Labs Lab 06/20/15 1757  06/23/15 0346  AST 23 17  ALT 21 21  ALKPHOS 75 58  BILITOT 0.7 0.6  PROT 7.2 5.8*  ALBUMIN 3.6 2.3*   No results for input(s): LIPASE, AMYLASE in the last 168 hours. No results for input(s): AMMONIA in the last 168 hours. CBC:  Recent Labs Lab 06/20/15 1755 06/21/15 0357 06/22/15 0915 06/23/15 0346 06/24/15 0341 06/26/15 0508  WBC 17.9* 17.7* 13.9* 8.1 9.2 8.7  NEUTROABS 16.2*  --  11.5* 7.2  --   --   HGB 14.7 12.1* 12.3* 11.9* 11.9* 12.6*  HCT 43.4 36.8* 37.4* 36.1* 34.8* 38.6*  MCV 87.0 87.4 88.2 87.6 85.9 87.9  PLT 253 188 196 224 231 293   Cardiac Enzymes: No results for input(s): CKTOTAL, CKMB, CKMBINDEX, TROPONINI in the last 168 hours. BNP: BNP (last 3 results) No results for input(s): BNP in the last 8760 hours.  ProBNP (last 3 results) No results for input(s): PROBNP in the last 8760 hours.  CBG: No results for input(s): GLUCAP in the last 168 hours.     SignedCalvert Cantor, MD [facility] 06/26/2015, 1:37 PM

## 2015-06-26 NOTE — Progress Notes (Signed)
Discharge papers printed,explained and given to patient and to her wife.Questions were answered satisfactorily at the time of discharged.No open skin issues noted on discharged.

## 2015-07-02 ENCOUNTER — Other Ambulatory Visit: Payer: Self-pay

## 2015-07-02 NOTE — Patient Outreach (Signed)
[facility]) Care Management  07/02/2015  Benjamin Sanders 10-23-1927 [id number]   Patient triggered RED on EMMI Pneumonia dashboard, notification sent to George Ina, RN.  Thanks, Corrie Mckusick. Sharlee Blew [facility] Care Management [facility] CM Assistant Phone: [phone] Fax: [phone]

## 2015-07-02 NOTE — Patient Outreach (Signed)
[facility]) Care Management  07/02/2015  Benjamin Sanders Jun 07, 1928 [id number]  3:30pm Telephone call to patient regarding EMMI pneumonia red referral. Unable to reach patient. Person answering phone states patient is asleep and request call back later today.  6:23pm Second telephone call to patient regarding EMMI pneumonia red referral.  Person answering phone states patient is asleep and request call back.   PLAN: RNCM will attempt 3rd telephone outreach to patient within 1 week.   George Ina RN,BSN,CCM [facility] Telephonic  [phone]

## 2015-07-04 ENCOUNTER — Other Ambulatory Visit: Payer: Self-pay

## 2015-07-04 NOTE — Patient Outreach (Addendum)
[facility]) Care Management  07/04/2015  Benjamin Sanders 1928-01-28 [id number]  Second telephone call to patient regarding EMMI pneumonia. Unable to reach patient. HIPAA compliant voice message left with call back phone number.   PLAN:  RNCM will attempt 2nd telephone call to patient within 1 week.  George Ina RN,BSN,CCM [facility] Telephonic  [phone]

## 2015-07-05 ENCOUNTER — Ambulatory Visit: Payer: Self-pay

## 2015-07-05 ENCOUNTER — Other Ambulatory Visit: Payer: Self-pay

## 2015-07-05 NOTE — Patient Outreach (Addendum)
[facility]) Care Management  07/05/2015  Benjamin Sanders 14-Mar-1928 [id number]  Second Telephone call to patient regarding EMMI pneumonia red REFERRAL.  Unable to reach patient. HIPAA compliant voice message left with call back phone number.   PLAN: RNCM will attempt 3rd telephone outreach within 2 weeks.   George Ina RN,BSN,CCM [facility] Telephonic  [phone]

## 2015-07-09 ENCOUNTER — Other Ambulatory Visit: Payer: Self-pay

## 2015-07-09 NOTE — Patient Outreach (Signed)
[facility]) Care Management  07/09/2015  Benjamin Sanders 25-Oct-1927 [id number]   Third telephone call to patient regarding EMMI pneumonia RED referral.  Person answering phone identified herself as patients wife.  States patient is not available.  Wife would not take [facility] care management contact phone number for patient to return call.   PLAN; RNCM will send patient outreach letter to attempt contact.   George Ina RN,BSN,CCM [facility] Telephonic  [phone]

## 2015-08-05 ENCOUNTER — Other Ambulatory Visit: Payer: Self-pay

## 2015-08-05 NOTE — Patient Outreach (Signed)
[facility]) Care Management  08/05/2015  Denson Niccoli 07/09/1927 [id number]   No response from patient after 3 telephone calls and outreach letter attempt.   PLAN:  RNCM will refer patient to Sherle Poe to close due to inability to establish contact with patient.  RNCM will notify patients primary MD of inability to establish contact.   George Ina RN,BSN,CCM [facility] Telephonic  [phone]

## 2016-01-16 ENCOUNTER — Telehealth: Payer: Self-pay | Admitting: Pulmonary Disease

## 2016-01-16 NOTE — Telephone Encounter (Signed)
IMAGING Port CXR 06/21/15 (personally reviewed by me): Left basilar opacification consistent with consolidation versus effusion. Low lung volumes but right lung is essentially clear.  MICROBIOLOGY Urine Strep Ag (06/20/15):  Positive Urine Legionella Ag (06/20/15):  Negative  Blood Ctx x2 (06/20/15):  Negative  Influenza PCR (06/21/15):  Negative  Sputum Ctx (06/21/15):  Oral Flora  LABS 06/26/15 BMP: 139/4.0/103/28/27/1.15/97/8.7 Magnesium: 2.0 CBC: 8.7/12.6/38.6/293  06/22/15 ABG on 100%:  7.37 / 40 / 84 / 93%

## 2016-01-17 ENCOUNTER — Ambulatory Visit (INDEPENDENT_AMBULATORY_CARE_PROVIDER_SITE_OTHER): Payer: Medicare Other | Admitting: Pulmonary Disease

## 2016-01-17 ENCOUNTER — Encounter: Payer: Self-pay | Admitting: *Deleted

## 2016-01-17 DIAGNOSIS — J45909 Unspecified asthma, uncomplicated: Secondary | ICD-10-CM

## 2016-01-17 DIAGNOSIS — E785 Hyperlipidemia, unspecified: Secondary | ICD-10-CM | POA: Insufficient documentation

## 2016-01-17 MED ORDER — ALBUTEROL SULFATE HFA 108 (90 BASE) MCG/ACT IN AERS: 1.0000 | INHALATION_SPRAY | RESPIRATORY_TRACT | 3 refills | Status: AC | PRN

## 2016-01-17 NOTE — Patient Instructions (Signed)
   Stop using the Asmanex inhaler you have.  I want you to use your Symbicort inhaler - 1 puff twice daily with your spacer. Remember to rinse, gargle and spit after you do you inhalation. Also remember to take your dentures out before you use your inhaler.   I'm sending in a prescription for an albuterol inhaler to use as needed. You can do 1-2 puffs every 4 hours as needed if you have any coughing, wheezing, or shortness of breath.  Call me if you have any questions about your medication or new breathing problems before your next appointment.  We will do a breathing test when I see you back in 6 weeks.  TESTS ORDERED: 1. Full PFTs at follow-up appointment

## 2016-01-17 NOTE — Progress Notes (Signed)
Subjective:    Patient ID: Benjamin Sanders, male    DOB: July 21, 1927, 82 y.o.   MRN: [id number]  HPI Patient was hospitalized with pneumonia in January 2017. He reports he was diagnosed with asthma in 1968. He reports he had wheezing and dyspnea with minimal coughing when he is exposed to exhaust fumes. He reports he does do his routine yard work and gardening. He uses a self-propelled push mower to mow his lawn which takes roughly 2 hours. He reports he also has symptoms when it is excessively hot and humid. He also has breathing problems with exposure to perfumes and strong odors. No nocturnal awakenings with any coughing, wheezing, or breathing problems. He reports he has had pneumonia only twice. He doesn't have frequent bronchitis. He reports mild sinus drainage but denies any sinus congestion or pressure. He denies any reflux or dyspepsia. Denies any morning brash water taste. Does have intermittent lower extremity edema in both legs. Denies any chest tightness or pain. He was prescribed Asmanex but hasn't been taking it due to the cost. He was also prescribed Symbicort 160/4.5 but has only been using 1 inhalation on an as needed basis.   Review of Systems No fever, chills, or sweats. No rashes but does bruise easily. A pertinent 14 point review of systems is negative except as per the history of presenting illness.  No Known Allergies  Current Outpatient Prescriptions on File Prior to Visit  Medication Sig Dispense Refill  . acetaminophen (TYLENOL) 500 MG tablet Take 1,000 mg by mouth every 6 (six) hours as needed for mild pain.    Marland Kitchen allopurinol (ZYLOPRIM) 100 MG tablet Take 100 mg by mouth daily.    Marland Kitchen amLODipine (NORVASC) 10 MG tablet Take 10 mg by mouth daily.    Marland Kitchen aspirin EC 81 MG tablet Take 81 mg by mouth daily. Reported on 06/20/2015    . Febuxostat (ULORIC) 80 MG TABS Take 40 mg by mouth daily.    . hydrochlorothiazide (MICROZIDE) 12.5 MG capsule Take 12.5 mg by mouth daily.    .  rosuvastatin (CRESTOR) 10 MG tablet Take 10 mg by mouth daily.     No current facility-administered medications on file prior to visit.     Past Medical History:  Diagnosis Date  . Asthma   . BPH (benign prostatic hyperplasia)   . CKD (chronic kidney disease)    stage 1  . Gout   . History of basal cell cancer    on back  . Hypercholesteremia   . Hypertension     Past Surgical History:  Procedure Laterality Date  . CATARACT EXTRACTION, BILATERAL    . HERNIA REPAIR      Family History  Problem Relation Age of Onset  . Stroke Mother   . Heart attack Father   . Cancer Sister     skin  . Leukemia Brother   . Lung disease Neg Hx     Social History   Social History  . Marital status: Married    Spouse name: N/A  . Number of children: N/A  . Years of education: N/A   Social History Main Topics  . Smoking status: Former Smoker    Packs/day: 0.10    Years: 21.00    Types: Cigarettes    Start date: 03/05/1946    Quit date: 09/14/1966  . Smokeless tobacco: Former Neurosurgeon    Types: Chew     Comment: smoked about 6-8 cigs daily.   . Alcohol use No  .  Drug use: No  . Sexual activity: No   Other Topics Concern  . None   Social History Narrative   Originally from [place]. Worked in [employer] as a young adult. Exposure to a foaming substance with the yarn that gave off fumes. He also worked for a [employer]. No known exposure to mold or asbestos. 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 No bird exposure. 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       Objective:   Physical Exam BP (!) 144/72 (BP Location: Left Arm, Cuff Size: Normal)   Pulse (!) 59   Ht 5\' 9"  (1.753 m)   Wt 200 lb 6.4 oz (90.9 kg)   SpO2 92%   BMI 29.59 kg/m  General:  Awake. Alert. No acute distress.   Integument:  Warm & dry. No rash on exposed skin. No bruising. Lymphatics:  No appreciated cervical or supraclavicular lymphadenoapthy. HEENT:  Moist mucus membranes. No oral  ulcers. No scleral injection or icterus. Dentures in place. Cardiovascular:  Regular rate. Trace lower extremity edema. Normal S1 & S2. Pulmonary:  Good aeration & clear to auscultation bilaterally. Symmetric chest wall expansion. No accessory muscle use on room air. Abdomen: Soft. Normal bowel sounds. Protuberant. Grossly nontender. Musculoskeletal:  Normal bulk and tone. Hand grip strength 5/5 bilaterally. No joint deformity or effusion appreciated. Neurological:  CN 2-12 grossly in tact. No meningismus. Moving all 4 extremities equally. Symmetric brachioradialis deep tendon reflexes. Psychiatric:  Mood and affect congruent. Speech normal rhythm, rate & tone.   PFT 11/18/15: FVC 3.46 L (95%) FEV1 2.44 L (96%) FEV1/FVC 0.71 FEF 25-75 1.60 L (98%)  IMAGING CXR PA/LAT 12/06/15 (per radiologist): Heart normal in size. Resolution of right midlung field infiltrate. Minimal swelling right lung base medially. No infiltrate, mass, edema, or effusion.  Port CXR 06/21/15 (personally reviewed by me): Left basilar opacification consistent with consolidation versus effusion. Low lung volumes but right lung is essentially clear.  MICROBIOLOGY Urine Strep Ag (06/20/15):  Positive Urine Legionella Ag (06/20/15):  Negative  Blood Ctx x2 (06/20/15):  Negative  Influenza PCR (06/21/15):  Negative  Sputum Ctx (06/21/15):  Oral Flora  LABS 06/26/15 BMP: 139/4.0/103/28/27/1.15/97/8.7 Magnesium: 2.0 CBC: 8.7/12.6/38.6/293  06/22/15 ABG on 100%:  7.37 / 40 / 84 / 93%    Assessment & Plan:  82 y.o. male with intermittent wheezing and dyspnea consistent with previous diagnosis of underlying asthma. With some symptomatic relief with intermittent use of Symbicort I am prescribing the patient Symbicort regularly as well as an as-needed basis albuterol inhaler. I instructed the patient to contact my office if he had any breathing problems or questions about his medications. I personally reviewed his spirometry which showed  no evidence of a fixed airway obstruction.  1. Asthma: Continuing patient on Symbicort 160/4.5 one inhalation twice daily with spacer. Prescribing the patient an albuterol inhaler to use on an as-needed basis. Checking full pulmonary function testing at next appointment. 2. Follow-up: Patient to return to clinic in 6 weeks or sooner if needed.   Donna Christen Jamison Neighbor, M.D. [facility] Pulmonary & Critical Care Pager:  [phone] After 3pm or if no response, call [phone] 11:25 AM 01/17/16

## 2016-03-02 ENCOUNTER — Ambulatory Visit (INDEPENDENT_AMBULATORY_CARE_PROVIDER_SITE_OTHER): Payer: Medicare Other | Admitting: Pulmonary Disease

## 2016-03-02 ENCOUNTER — Ambulatory Visit (HOSPITAL_COMMUNITY)
Admission: RE | Admit: 2016-03-02 | Discharge: 2016-03-02 | Disposition: A | Discharge status: Home / Self Care | Payer: Medicare Other | Source: Ambulatory Visit | Attending: Pulmonary Disease | Admitting: Pulmonary Disease

## 2016-03-02 ENCOUNTER — Encounter: Payer: Self-pay | Admitting: Pulmonary Disease

## 2016-03-02 VITALS — BP 140/70 | HR 52 | Ht 69.0 in | Wt 200.0 lb

## 2016-03-02 DIAGNOSIS — J988 Other specified respiratory disorders: Secondary | ICD-10-CM | POA: Diagnosis not present

## 2016-03-02 DIAGNOSIS — J45909 Unspecified asthma, uncomplicated: Secondary | ICD-10-CM

## 2016-03-02 LAB — PULMONARY FUNCTION TEST
DL/VA % PRED: 61 %
DL/VA: 2.75 ml/min/mmHg/L
DLCO unc % pred: 50 %
DLCO unc: 15.52 ml/min/mmHg
FEF 25-75 Post: 1.08 L/sec
FEF 25-75 Pre: 0.97 L/sec
FEF2575-%CHANGE-POST: 12 %
FEF2575-%Pred-Post: 68 %
FEF2575-%Pred-Pre: 61 %
FEV1-%CHANGE-POST: 3 %
FEV1-%PRED-POST: 95 %
FEV1-%PRED-PRE: 93 %
FEV1-PRE: 2.32 L
FEV1-Post: 2.39 L
FEV1FVC-%CHANGE-POST: 2 %
FEV1FVC-%Pred-Pre: 90 %
FEV6-%Change-Post: 1 %
FEV6-%PRED-PRE: 101 %
FEV6-%Pred-Post: 102 %
FEV6-POST: 3.41 L
FEV6-PRE: 3.36 L
FEV6FVC-%Change-Post: 0 %
FEV6FVC-%PRED-POST: 100 %
FEV6FVC-%Pred-Pre: 99 %
FVC-%CHANGE-POST: 0 %
FVC-%PRED-PRE: 102 %
FVC-%Pred-Post: 102 %
FVC-POST: 3.69 L
FVC-PRE: 3.67 L
POST FEV6/FVC RATIO: 92 %
PRE FEV6/FVC RATIO: 92 %
Post FEV1/FVC ratio: 65 %
Pre FEV1/FVC ratio: 63 %
RV % pred: 115 %
RV: 3.14 L
TLC % PRED: 99 %
TLC: 6.83 L

## 2016-03-02 MED ORDER — ALBUTEROL SULFATE (2.5 MG/3ML) 0.083% IN NEBU
2.5000 mg | INHALATION_SOLUTION | Freq: Once | RESPIRATORY_TRACT | Status: AC
  Administered 2016-03-02: 2.5 mg via RESPIRATORY_TRACT

## 2016-03-02 NOTE — Progress Notes (Signed)
Subjective:    Patient ID: Benjamin Sanders, male    DOB: 11/02/1927, 82 y.o.   MRN: [id number]  C.C.:  Follow-up for Asthma.  HPI Asthma:  Patient has mild airway obstruction on pulmonary function testing today. Prescribed Symbicort 160/4.5 1 inhalation twice daily. He reports his previous wheezing has totally resolved. He denies any coughing or wheezing. Stopped Asmanex at last appointment. He is using his albuterol twice a day as a matter of habit. No nocturnal awakenings with any breathing problems.  Review of Systems No chest tightness or pain. No fever, chills, or sweats. No nausea or emesis.   No Known Allergies  Current Outpatient Prescriptions on File Prior to Visit  Medication Sig Dispense Refill  . acetaminophen (TYLENOL) 500 MG tablet Take 1,000 mg by mouth every 6 (six) hours as needed for mild pain.    Marland Kitchen. albuterol (PROVENTIL HFA;VENTOLIN HFA) 108 (90 Base) MCG/ACT inhaler Inhale 1-2 puffs into the lungs every 4 (four) hours as needed for wheezing or shortness of breath (cough). 1 Inhaler 3  . allopurinol (ZYLOPRIM) 100 MG tablet Take 100 mg by mouth daily.    Marland Kitchen. amLODipine (NORVASC) 10 MG tablet Take 10 mg by mouth daily.    Marland Kitchen. aspirin EC 81 MG tablet Take 81 mg by mouth daily. Reported on 06/20/2015    . budesonide-formoterol (SYMBICORT) 160-4.5 MCG/ACT inhaler Inhale 2 puffs into the lungs 3 times/day as needed-between meals & bedtime.    . carvedilol (COREG) 12.5 MG tablet Take 12.5 mg by mouth 2 (two) times daily with a meal.    . Febuxostat (ULORIC) 80 MG TABS Take 40 mg by mouth daily.    . hydrochlorothiazide (MICROZIDE) 12.5 MG capsule Take 12.5 mg by mouth daily.    . rosuvastatin (CRESTOR) 10 MG tablet Take 10 mg by mouth daily.     No current facility-administered medications on file prior to visit.     Past Medical History:  Diagnosis Date  . Asthma   . BPH (benign prostatic hyperplasia)   . CKD (chronic kidney disease)    stage 1  . Gout   . History of basal  cell cancer    on back  . Hypercholesteremia   . Hypertension     Past Surgical History:  Procedure Laterality Date  . CATARACT EXTRACTION, BILATERAL    . HERNIA REPAIR      Family History  Problem Relation Age of Onset  . Stroke Mother   . Heart attack Father   . Cancer Sister     skin  . Leukemia Brother   . Lung disease Neg Hx     Social History   Social History  . Marital status: Married    Spouse name: N/A  . Number of children: N/A  . Years of education: N/A   Social History Main Topics  . Smoking status: Former Smoker    Packs/day: 0.10    Years: 21.00    Types: Cigarettes    Start date: 03/05/1946    Quit date: 09/14/1966  . Smokeless tobacco: Former NeurosurgeonUser    Types: Chew     Comment: smoked about 6-8 cigs daily.   . Alcohol use No  . Drug use: No  . Sexual activity: No   Other Topics Concern  . None   Social History Narrative   [place] Worked in [employer] as a young adult. Exposure to a foaming substance with the yarn that gave off fumes. He also worked for  a [employer]. No known exposure to mold or asbestos. 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 No bird exposure. 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       Objective:   Physical Exam BP 140/70 (BP Location: Right Arm, Patient Position: Sitting, Cuff Size: Normal)   Pulse (!) 52   Ht 5\' 9"  (1.753 m)   Wt 200 lb (90.7 kg)   SpO2 92%   BMI 29.53 kg/m  General:  Awake. Alert. No distress.   Integument:  Warm & dry. No rash on exposed skin. Lymphatics:  No appreciated cervical or supraclavicular lymphadenoapthy. HEENT:  Moist mucus membranes. No oral ulcers. Minimal nasal turbinate swelling. Cardiovascular:  Regular rate. Trace lower extremity edema unchanged. Normal S1 & S2. Pulmonary:  Clear bilaterally to auscultation. Normal work of breathing on room air & speaking in complete sentences. Abdomen: Soft. Normal bowel sounds. Nontender.  PFT 03/02/16: FVC  3.67 L (102%) FEV1 2.32 L (93%) FEV1/FVC 0.63 FEF 25-75 0.97 L (61%) negative bronchodilator response TLC 6.83 L (99%) RV 115% ERV 60% DLCO uncorrected 50% 11/18/15: FVC 3.46 L (95%) FEV1 2.44 L (96%) FEV1/FVC 0.71 FEF 25-75 1.60 L (98%)  IMAGING CXR PA/LAT 12/06/15 (per radiologist): Heart normal in size. Resolution of right midlung field infiltrate. Minimal swelling right lung base medially. No infiltrate, mass, edema, or effusion.  Port CXR 06/21/15 (previously reviewed by me): Left basilar opacification consistent with consolidation versus effusion. Low lung volumes but right lung is essentially clear.  MICROBIOLOGY Urine Strep Ag (06/20/15):  Positive Urine Legionella Ag (06/20/15):  Negative  Blood Ctx x2 (06/20/15):  Negative  Influenza PCR (06/21/15):  Negative  Sputum Ctx (06/21/15):  Oral Flora  LABS 06/26/15 BMP: 139/4.0/103/28/27/1.15/97/8.7 Magnesium: 2.0 CBC: 8.7/12.6/38.6/293  06/22/15 ABG on 100%:  7.37 / 40 / 84 / 93%    Assessment & Plan:  82 y.o. male with intermittent wheezing and dyspnea consistent with previous diagnosis of underlying asthma. Patient does have mild airway obstruction based on spirometry today with a moderately reduced carbon monoxide diffusion capacity. His asthma seems to be reasonably well controlled at this time and with resolution of his previous wheezing I feel it's reasonable to attempt to come off of his inhaled steroid completely for now. We will need to assess whether or not he has oxygen requirement with exertion at next appointment.  1. Asthma: Trying the patient off Symbicort completely. He will continue to use his albuterol inhaler as needed. Repeat spirometry with bronchodilator challenge at next appointment. 2. Health Maintenance:  S/P Prevnar April 2016 & Pneumovax January 2016. Patient to have influenza vaccine at his primary care physician's office soon. 3. Follow-up: Patient to return to clinic in 3 months or sooner if needed.   Donna Christen  Jamison Neighbor, M.D. [facility] Pulmonary & Critical Care Pager:  [phone] After 3pm or if no response, call [phone] 12:25 PM 03/02/16

## 2016-03-02 NOTE — Patient Instructions (Addendum)
   Try stopping your Symbicort inhaler and see how your breathing does. If you notice any coughing, wheezing, or more shortness of breath start taking it again - 1 puff twice daily.   You can use your Albuterol Inhaler as needed - 2 puffs every 4 hours to help with any coughing, wheezing, or shortness of breath.  I will see you back in 3 months or sooner if needed. Call me if you have any new breathing problems and need to be seen sooner.  TESTS ORDERED: 1. Spirometry with bronchodilator challenge at next appointment 2. Six-minute walk test on room air at next appointment

## 2016-04-07 ENCOUNTER — Encounter (HOSPITAL_COMMUNITY): Payer: Self-pay | Admitting: Emergency Medicine

## 2016-04-07 ENCOUNTER — Emergency Department (HOSPITAL_COMMUNITY)
Admission: EM | Admit: 2016-04-07 | Discharge: 2016-04-08 | Disposition: A | Discharge status: Home / Self Care | Payer: Medicare Other | Attending: Emergency Medicine | Admitting: Emergency Medicine

## 2016-04-07 ENCOUNTER — Emergency Department (HOSPITAL_COMMUNITY): Payer: Medicare Other

## 2016-04-07 DIAGNOSIS — J45909 Unspecified asthma, uncomplicated: Secondary | ICD-10-CM | POA: Diagnosis not present

## 2016-04-07 DIAGNOSIS — Z7982 Long term (current) use of aspirin: Secondary | ICD-10-CM | POA: Insufficient documentation

## 2016-04-07 DIAGNOSIS — R079 Chest pain, unspecified: Secondary | ICD-10-CM | POA: Diagnosis present

## 2016-04-07 DIAGNOSIS — Z87891 Personal history of nicotine dependence: Secondary | ICD-10-CM | POA: Diagnosis not present

## 2016-04-07 DIAGNOSIS — I129 Hypertensive chronic kidney disease with stage 1 through stage 4 chronic kidney disease, or unspecified chronic kidney disease: Secondary | ICD-10-CM | POA: Insufficient documentation

## 2016-04-07 DIAGNOSIS — N183 Chronic kidney disease, stage 3 (moderate): Secondary | ICD-10-CM | POA: Diagnosis not present

## 2016-04-07 DIAGNOSIS — R0789 Other chest pain: Secondary | ICD-10-CM | POA: Diagnosis not present

## 2016-04-07 DIAGNOSIS — Z79899 Other long term (current) drug therapy: Secondary | ICD-10-CM | POA: Insufficient documentation

## 2016-04-07 LAB — CBC
HCT: 43.9 % (ref 39.0–52.0)
Hemoglobin: 14.9 g/dL (ref 13.0–17.0)
MCH: 29.7 pg (ref 26.0–34.0)
MCHC: 33.9 g/dL (ref 30.0–36.0)
MCV: 87.6 fL (ref 78.0–100.0)
Platelets: 389 10*3/uL (ref 150–400)
RBC: 5.01 MIL/uL (ref 4.22–5.81)
RDW: 12.7 % (ref 11.5–15.5)
WBC: 9.1 10*3/uL (ref 4.0–10.5)

## 2016-04-07 LAB — BASIC METABOLIC PANEL
ANION GAP: 8 (ref 5–15)
BUN: 23 mg/dL — ABNORMAL HIGH (ref 6–20)
CHLORIDE: 103 mmol/L (ref 101–111)
CO2: 27 mmol/L (ref 22–32)
Calcium: 9.8 mg/dL (ref 8.9–10.3)
Creatinine, Ser: 1.77 mg/dL — ABNORMAL HIGH (ref 0.61–1.24)
GFR calc non Af Amer: 33 mL/min — ABNORMAL LOW (ref >60)
GFR, EST AFRICAN AMERICAN: 38 mL/min — AB (ref >60)
Glucose, Bld: 121 mg/dL — ABNORMAL HIGH (ref 65–99)
POTASSIUM: 4.4 mmol/L (ref 3.5–5.1)
SODIUM: 138 mmol/L (ref 135–145)

## 2016-04-07 LAB — I-STAT TROPONIN, ED: TROPONIN I, POC: 0 ng/mL (ref 0.00–0.08)

## 2016-04-07 MED ORDER — ALBUTEROL SULFATE HFA 108 (90 BASE) MCG/ACT IN AERS
2.0000 | INHALATION_SPRAY | Freq: Once | RESPIRATORY_TRACT | Status: AC
  Administered 2016-04-07: 2 via RESPIRATORY_TRACT
  Filled 2016-04-07: qty 6.7

## 2016-04-07 NOTE — ED Provider Notes (Signed)
[facility]-EMERGENCY DEPT Provider Note   CSN: [id number] Arrival date & time: 04/07/16  1903  By signing my name below, I, Clovis PuAvnee Patel, attest that this documentation has been prepared under the direction and in the presence of Shon Batonourtney F Willies Laviolette, MD  Electronically Signed: Clovis PuAvnee Patel, ED Scribe. 04/07/16. 11:19 PM.   History   Chief Complaint Chief Complaint  Patient presents with  . Chest Pain    The history is provided by the patient. No language interpreter was used.   HPI Comments:  Benjamin Sanders is a 82 y.o. male, with a hx of PNA, asthma, HTN and hyperlipidemia, who presents to the Emergency Department complaining of intermittent chest pain x 3-4 months. Pt states his last episode of chest pain was about 1 hour ago and lasted for about 1 minute. He states his episodes of pain usually last ~ 1-2 minutes. Venous self-limited and nothing seems to make it better or worse. Pt denies diaphoresis, cough, current leg swelling, and SOB. He denies current pain. Pt has never seen a cardiologist. Pt's wife states he was in the hospital for 1 week for pneumonia. He is a non-smoker. No known drug allergies. Pt denies any other complaints at this time.   Past Medical History:  Diagnosis Date  . Asthma   . BPH (benign prostatic hyperplasia)   . CKD (chronic kidney disease)    stage 1  . Gout   . History of basal cell cancer    on back  . Hypercholesteremia   . Hypertension     Patient Active Problem List   Diagnosis Date Noted  . Asthma 01/17/2016  . Hyperlipidemia 01/17/2016  . Pedal edema 06/25/2015  . Essential hypertension 06/20/2015  . Gout 06/20/2015  . CKD (chronic kidney disease), stage III 06/20/2015    Past Surgical History:  Procedure Laterality Date  . CATARACT EXTRACTION, BILATERAL    . HERNIA REPAIR         Home Medications    Prior to Admission medications   Medication Sig Start Date End Date Taking? Authorizing Provider  acetaminophen (TYLENOL) 500 MG  tablet Take 1,000 mg by mouth every 6 (six) hours as needed for mild pain.   Yes Historical Provider, MD  albuterol (PROVENTIL HFA;VENTOLIN HFA) 108 (90 Base) MCG/ACT inhaler Inhale 1-2 puffs into the lungs every 4 (four) hours as needed for wheezing or shortness of breath (cough). 01/17/16  Yes Roslynn AmbleJennings E Nestor, MD  allopurinol (ZYLOPRIM) 100 MG tablet Take 100 mg by mouth daily.   Yes Historical Provider, MD  amLODipine (NORVASC) 10 MG tablet Take 10 mg by mouth daily.   Yes Historical Provider, MD  aspirin EC 81 MG tablet Take 81 mg by mouth daily. Reported on 06/20/2015   Yes Historical Provider, MD  carvedilol (COREG) 12.5 MG tablet Take 12.5 mg by mouth 2 (two) times daily with a meal.   Yes Historical Provider, MD  Febuxostat (ULORIC) 80 MG TABS Take 40 mg by mouth daily.   Yes Historical Provider, MD  hydrochlorothiazide (MICROZIDE) 12.5 MG capsule Take 12.5 mg by mouth daily.   Yes Historical Provider, MD  Polyethyl Glycol-Propyl Glycol (SYSTANE FREE OP) Place 1 drop into both eyes daily as needed. For dry eyes   Yes Historical Provider, MD  rosuvastatin (CRESTOR) 10 MG tablet Take 10 mg by mouth daily.   Yes Historical Provider, MD  predniSONE (DELTASONE) 20 MG tablet Take 2 tablets (40 mg total) by mouth daily. 04/08/16   Shon Batonourtney F Jazleen Robeck, MD  Family History Family History  Problem Relation Age of Onset  . Stroke Mother   . Heart attack Father   . Cancer Sister     skin  . Leukemia Brother   . Lung disease Neg Hx     Social History Social History  Substance Use Topics  . Smoking status: Former Smoker    Packs/day: 0.10    Years: 21.00    Types: Cigarettes    Start date: 03/05/1946    Quit date: 09/14/1966  . Smokeless tobacco: Former Neurosurgeon    Types: Chew     Comment: smoked about 6-8 cigs daily.   . Alcohol use No     Allergies   Review of patient's allergies indicates no known allergies.   Review of Systems Review of Systems  Constitutional: Negative for  diaphoresis.  Respiratory: Negative for cough and shortness of breath.   Cardiovascular: Positive for chest pain. Negative for leg swelling.  Gastrointestinal: Negative for abdominal pain, nausea and vomiting.  All other systems reviewed and are negative.    Physical Exam Updated Vital Signs BP 138/80   Pulse 72   Temp 98.2 F (36.8 C) (Oral)   Resp 18   Ht 5\' 9"  (1.753 m)   Wt 187 lb (84.8 kg)   SpO2 91%   BMI 27.62 kg/m   Physical Exam  Constitutional: He is oriented to person, place, and time. He appears well-developed and well-nourished.  Elderly, no acute distress  HENT:  Head: Normocephalic and atraumatic.  Cardiovascular: Normal rate, regular rhythm and normal heart sounds.   No murmur heard. Pulmonary/Chest: Effort normal. No respiratory distress. He has wheezes.  Abdominal: Soft. Bowel sounds are normal. There is no tenderness. There is no rebound.  Musculoskeletal: He exhibits edema.  Trace bilateral lower extremity edema  Neurological: He is alert and oriented to person, place, and time.  Skin: Skin is warm and dry.  Psychiatric: He has a normal mood and affect.  Nursing note and vitals reviewed.    ED Treatments / Results  DIAGNOSTIC STUDIES:  Oxygen Saturation is 94% on RA, adequate by my interpretation.    COORDINATION OF CARE:  11:12 PM Discussed treatment plan with pt at bedside and pt agreed to plan.  Labs (all labs ordered are listed, but only abnormal results are displayed) Labs Reviewed  BASIC METABOLIC PANEL - Abnormal; Notable for the following:       Result Value   Glucose, Bld 121 (*)    BUN 23 (*)    Creatinine, Ser 1.77 (*)    GFR calc non Af Amer 33 (*)    GFR calc Af Amer 38 (*)    All other components within normal limits  CBC  I-STAT TROPOININ, ED  I-STAT TROPOININ, ED    EKG  EKG Interpretation  Date/Time:  Tuesday April 07 2016 19:12:05 EDT Ventricular Rate:  81 PR Interval:  176 QRS Duration: 108 QT  Interval:  358 QTC Calculation: 415 R Axis:   -54 Text Interpretation:  Normal sinus rhythm with sinus arrhythmia Incomplete right bundle branch block Left anterior fascicular block Moderate voltage criteria for LVH, may be normal variant Cannot rule out Septal infarct , age undetermined Abnormal ECG No significant change since last tracing Confirmed by FLOYD MD, DANIEL [phone]) on 04/07/2016 10:55:24 PM       Radiology Dg Chest 2 View  Result Date: 04/07/2016 CLINICAL DATA:  82 y/o  M; central stinging chest pain for 2 days. EXAM: CHEST  2 VIEW COMPARISON:  06/21/2015 chest radiograph FINDINGS: Stable cardiac silhouette within normal limits given projection and technique. Aortic atherosclerosis with arch calcification. Linear opacities at the left lung base probably represent scarring and/or atelectasis. No focal consolidation. No pneumothorax or pleural effusion. Multilevel degenerative changes of the thoracic spine. IMPRESSION: No active cardiopulmonary disease. Electronically Signed   By: Mitzi Hansen M.D.   On: 04/07/2016 20:24    Procedures Procedures (including critical care time)  Medications Ordered in ED Medications  albuterol (PROVENTIL HFA;VENTOLIN HFA) 108 (90 Base) MCG/ACT inhaler 2 puff (2 puffs Inhalation Given 04/07/16 2345)  predniSONE (DELTASONE) tablet 60 mg (60 mg Oral Given 04/08/16 0151)  albuterol (PROVENTIL HFA;VENTOLIN HFA) 108 (90 Base) MCG/ACT inhaler 2 puff (2 puffs Inhalation Given 04/08/16 0151)     Initial Impression / Assessment and Plan / ED Course  I have reviewed the triage vital signs and the nursing notes.  Pertinent labs & imaging results that were available during my care of the patient were reviewed by me and considered in my medical decision making (see chart for details).  Clinical Course   Patient presents with chest pain. Very atypical for ACS. Last minutes. Nonexertional. He is currently asymptomatic. Reports history of asthma.  He is wheezing on exam. O2 sats low 90s. He denies shortness of breath. Given history of smoking, patient likely lives mildly hypoxic. He is otherwise nontoxic. EKG shows no signs of acute ischemia and is unchanged from prior.  Patient given an inhaler and prednisone. He has not had any recurrent chest pain in the ER. He does have risk factors and given his age, his heart score is 4. I discussed at length with the patient and his wife options including admission for chest pain rule out versus discharge and close cardiology follow-up.  Patient would like to go home. Repeat troponin is reassuring. He did state mildly hypoxic but remained asymptomatic. He was able to ambulate and momentarily dropped his O2 sats but rebounded without difficulty and was asymptomatic. Again feel he is likely chronically mildly hypoxic. Will place on a burst dose of steroids and given an inhaler. Patient was instructed to follow-up very closely with his primary physician for repeat evaluation. He was also given cardiology follow-up for stress testing.  After history, exam, and medical workup I feel the patient has been appropriately medically screened and is safe for discharge home. Pertinent diagnoses were discussed with the patient. Patient was given return precautions.    Final Clinical Impressions(s) / ED Diagnoses   Final diagnoses:  Atypical chest pain  Uncomplicated asthma, unspecified asthma severity, unspecified whether persistent    New Prescriptions Discharge Medication List as of 04/08/2016  1:49 AM    START taking these medications   Details  predniSONE (DELTASONE) 20 MG tablet Take 2 tablets (40 mg total) by mouth daily., Starting Wed 04/08/2016, Print       I personally performed the services described in this documentation, which was scribed in my presence. The recorded information has been reviewed and is accurate.     Shon Baton, MD 04/08/16 [phone]

## 2016-04-07 NOTE — ED Triage Notes (Signed)
Patient reports intermittent central chest pain with SOB and nausea onset yesterday , no chest pain at arrival , denies diaphoresis .

## 2016-04-08 LAB — I-STAT TROPONIN, ED: TROPONIN I, POC: 0 ng/mL (ref 0.00–0.08)

## 2016-04-08 MED ORDER — ALBUTEROL SULFATE HFA 108 (90 BASE) MCG/ACT IN AERS
2.0000 | INHALATION_SPRAY | Freq: Once | RESPIRATORY_TRACT | Status: AC
  Administered 2016-04-08: 2 via RESPIRATORY_TRACT

## 2016-04-08 MED ORDER — PREDNISONE 20 MG PO TABS: 40.0000 mg | ORAL_TABLET | Freq: Every day | ORAL | 0 refills | Status: DC

## 2016-04-08 MED ORDER — PREDNISONE 20 MG PO TABS
60.0000 mg | ORAL_TABLET | Freq: Once | ORAL | Status: AC
  Administered 2016-04-08: 60 mg via ORAL
  Filled 2016-04-08: qty 3

## 2016-04-08 NOTE — Discharge Instructions (Signed)
You were seen today for chest pain. Your cardiac workup is reassuring. You were noted to be wheezing and her oxygen is borderline low. Because of your history of asthma and smoking this may be normal for you given the otherwise are asymptomatic. He will be given several days of prednisone and need to use an inhaler. You need to follow-up very closely with her primary physician for repeat pulse ox check. You also need to follow with cardiology for stress testing. If you have any new or worsening symptoms you need to be reevaluated immediately.

## 2016-04-08 NOTE — ED Notes (Signed)
Pt spo2 with ambulation averaging 88% RA- Pt did drop to 85% momentarily but was not symptomatic. sats increased to 89% with rest

## 2016-05-18 ENCOUNTER — Encounter: Payer: Self-pay | Admitting: Cardiovascular Disease

## 2016-05-18 NOTE — Progress Notes (Signed)
EKG from Dr Perini's office reviewed and unchanged from baseline. NSR with LAFB, possible age-indeterminate septal infarct.   Tonny BollmanCooper, Dean Goldner 05/18/2016 11:11 AM

## 2016-06-02 ENCOUNTER — Ambulatory Visit: Payer: Medicare Other | Admitting: Pulmonary Disease

## 2016-06-02 ENCOUNTER — Ambulatory Visit: Payer: Medicare Other

## 2017-01-23 IMAGING — CR DG CHEST 1V PORT
1 series · 1 of 1 positions shown · non-contrast
Comparison: October 05, 2013

CLINICAL DATA: Cough and shortness of breath since yesterday.

EXAM:
PORTABLE CHEST 1 VIEW

[AP]
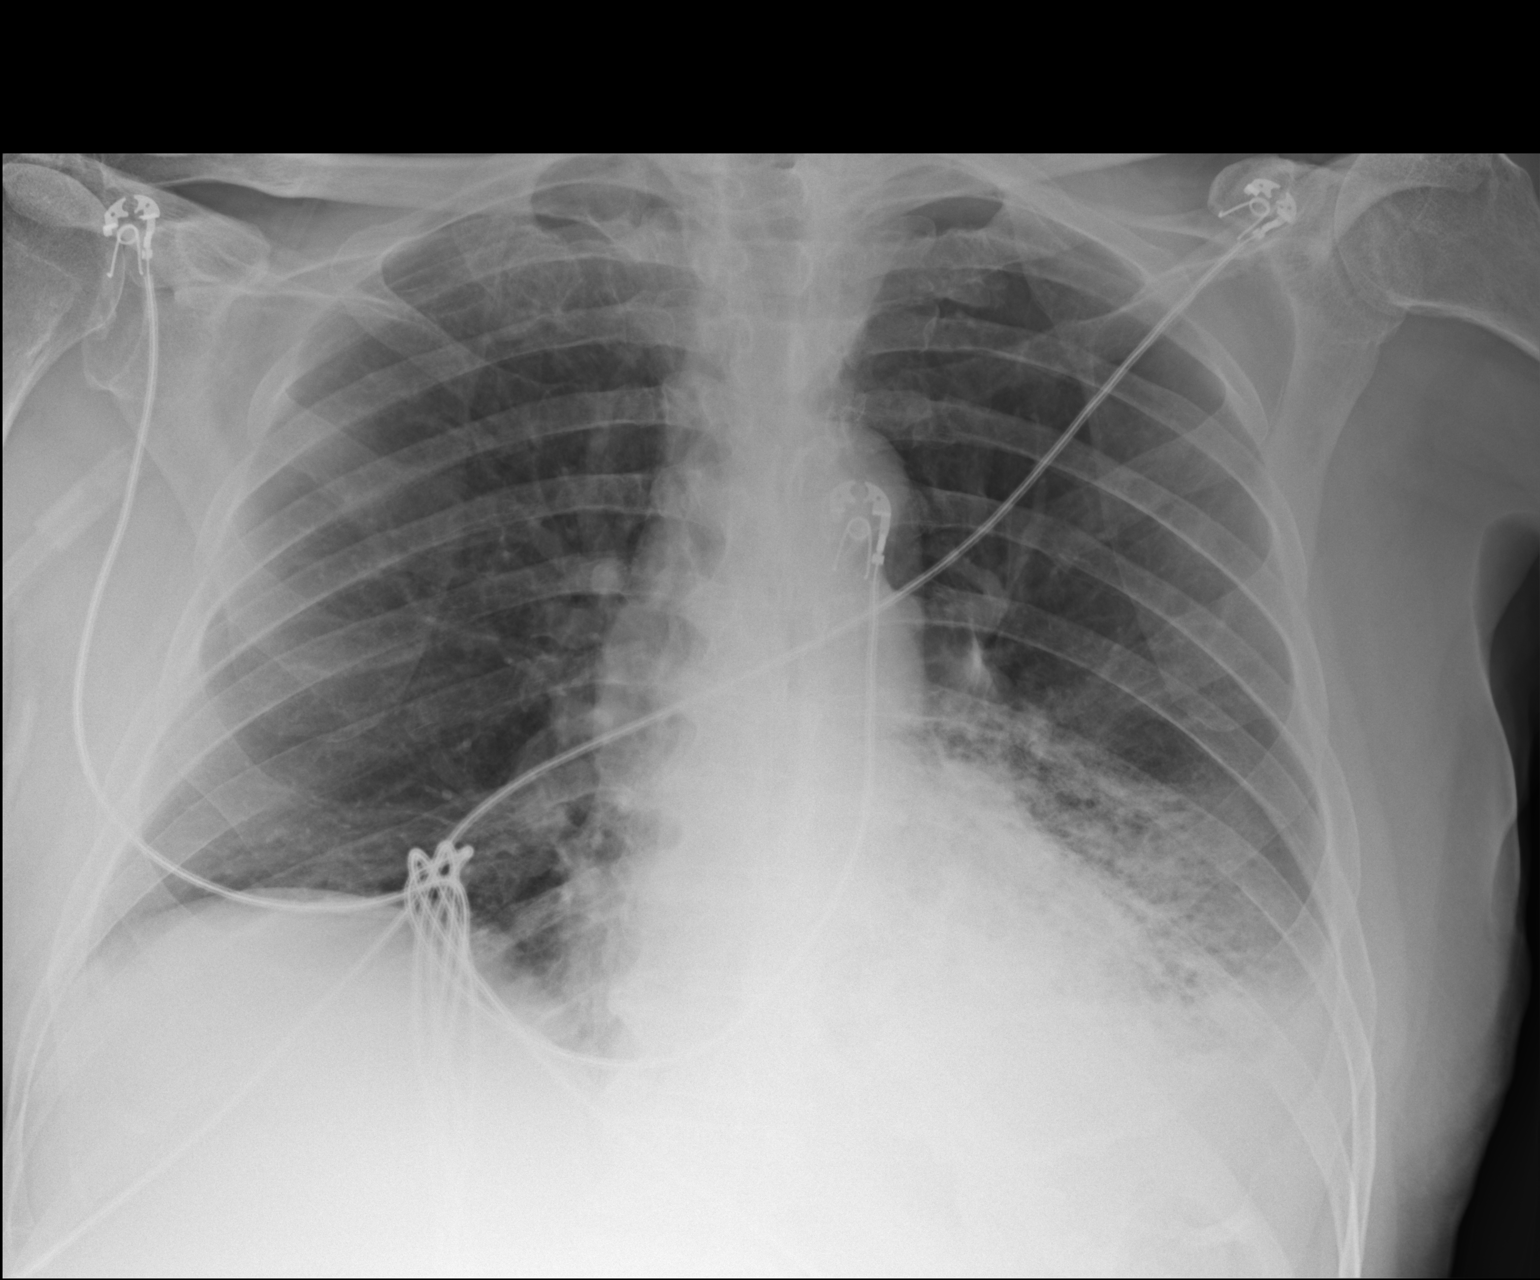

[1 of 1 positions shown; findings below may reference images not displayed]

FINDINGS: The heart size and mediastinal contours are within normal limits.
There is a consolidation of left lung base with small left pleural
effusion. There is no pulmonary edema. The visualized skeletal
structures are unremarkable.
IMPRESSION: Left lung base pneumonia with small left pleural effusion.

## 2017-11-11 IMAGING — CR DG CHEST 2V
2 series · 2 of 2 positions shown · non-contrast
Comparison: 06/21/2015 chest radiograph

CLINICAL DATA: 88 y/o  M; central stinging chest pain for 2 days.

EXAM:
CHEST  2 VIEW

[chest pa]
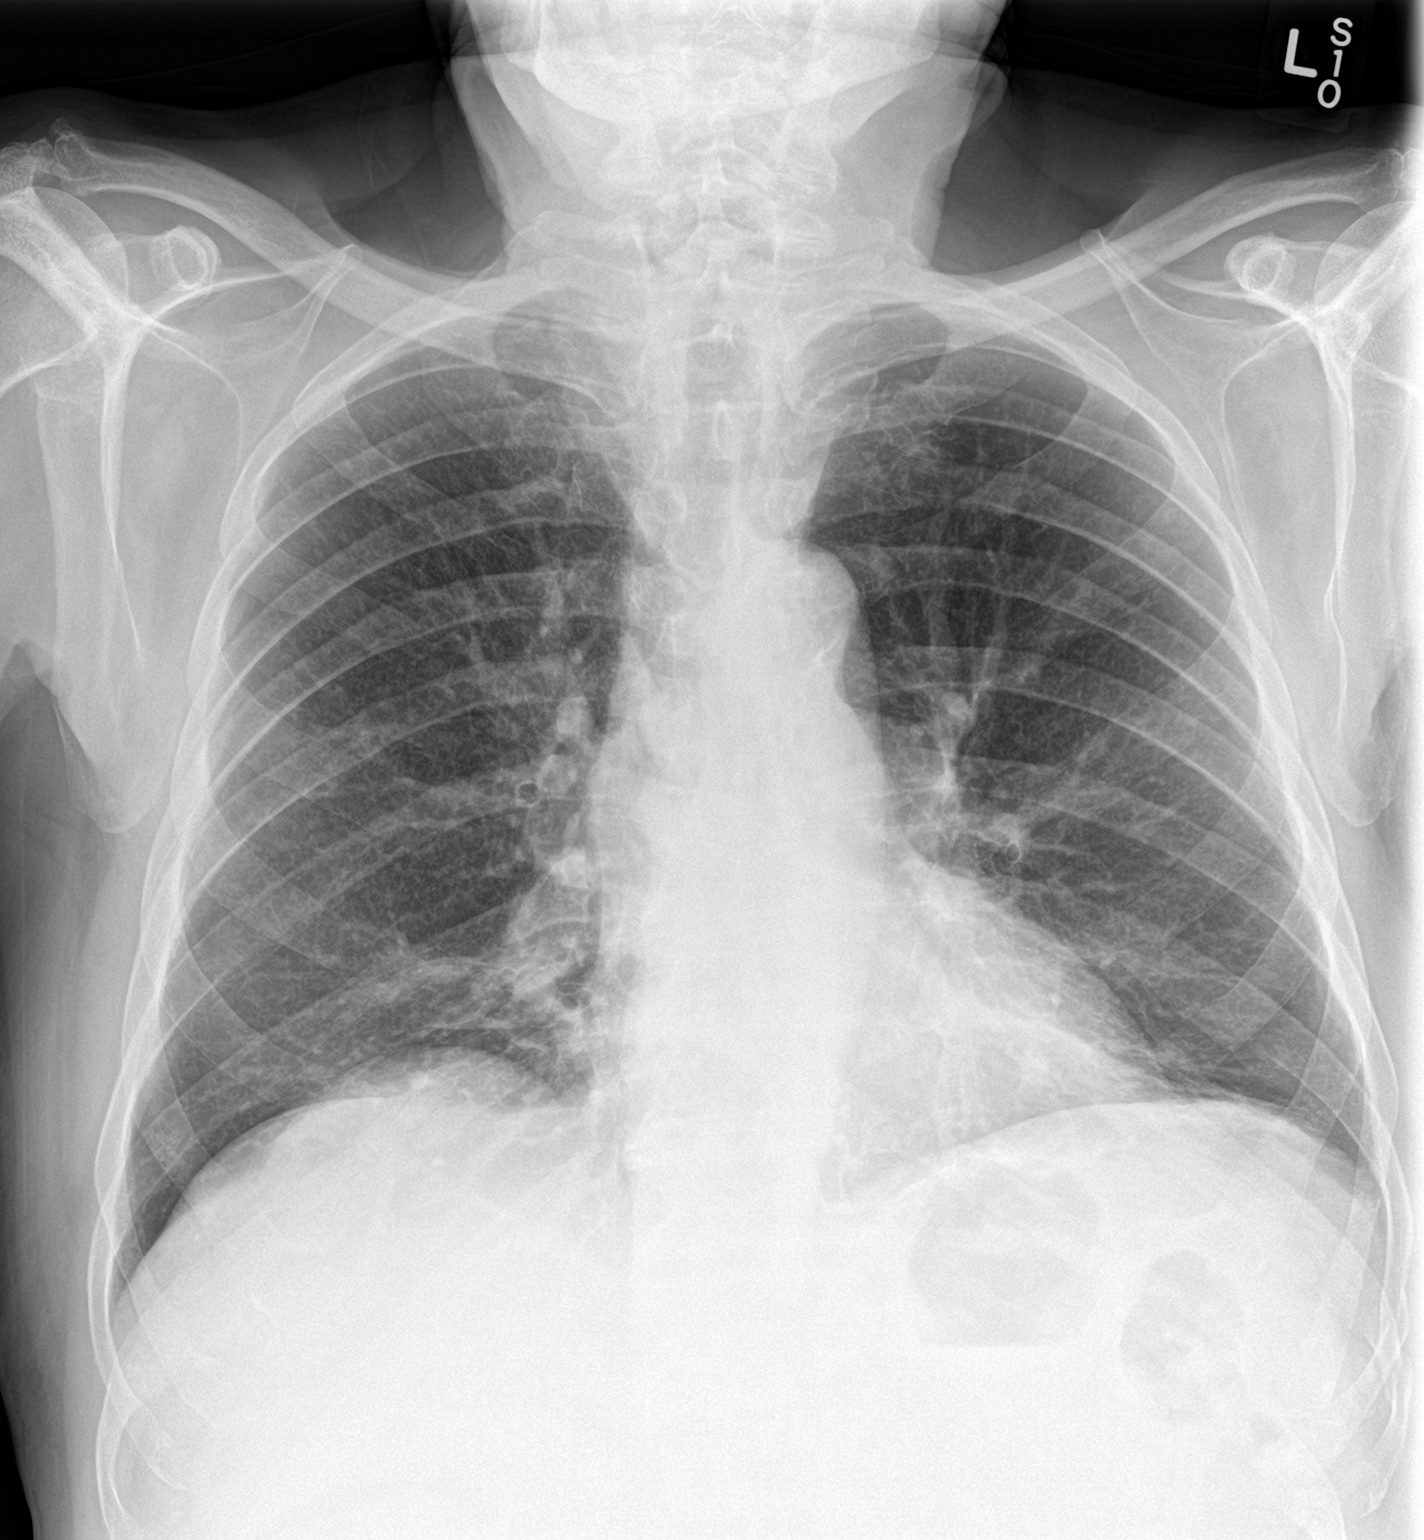

[chest lat]
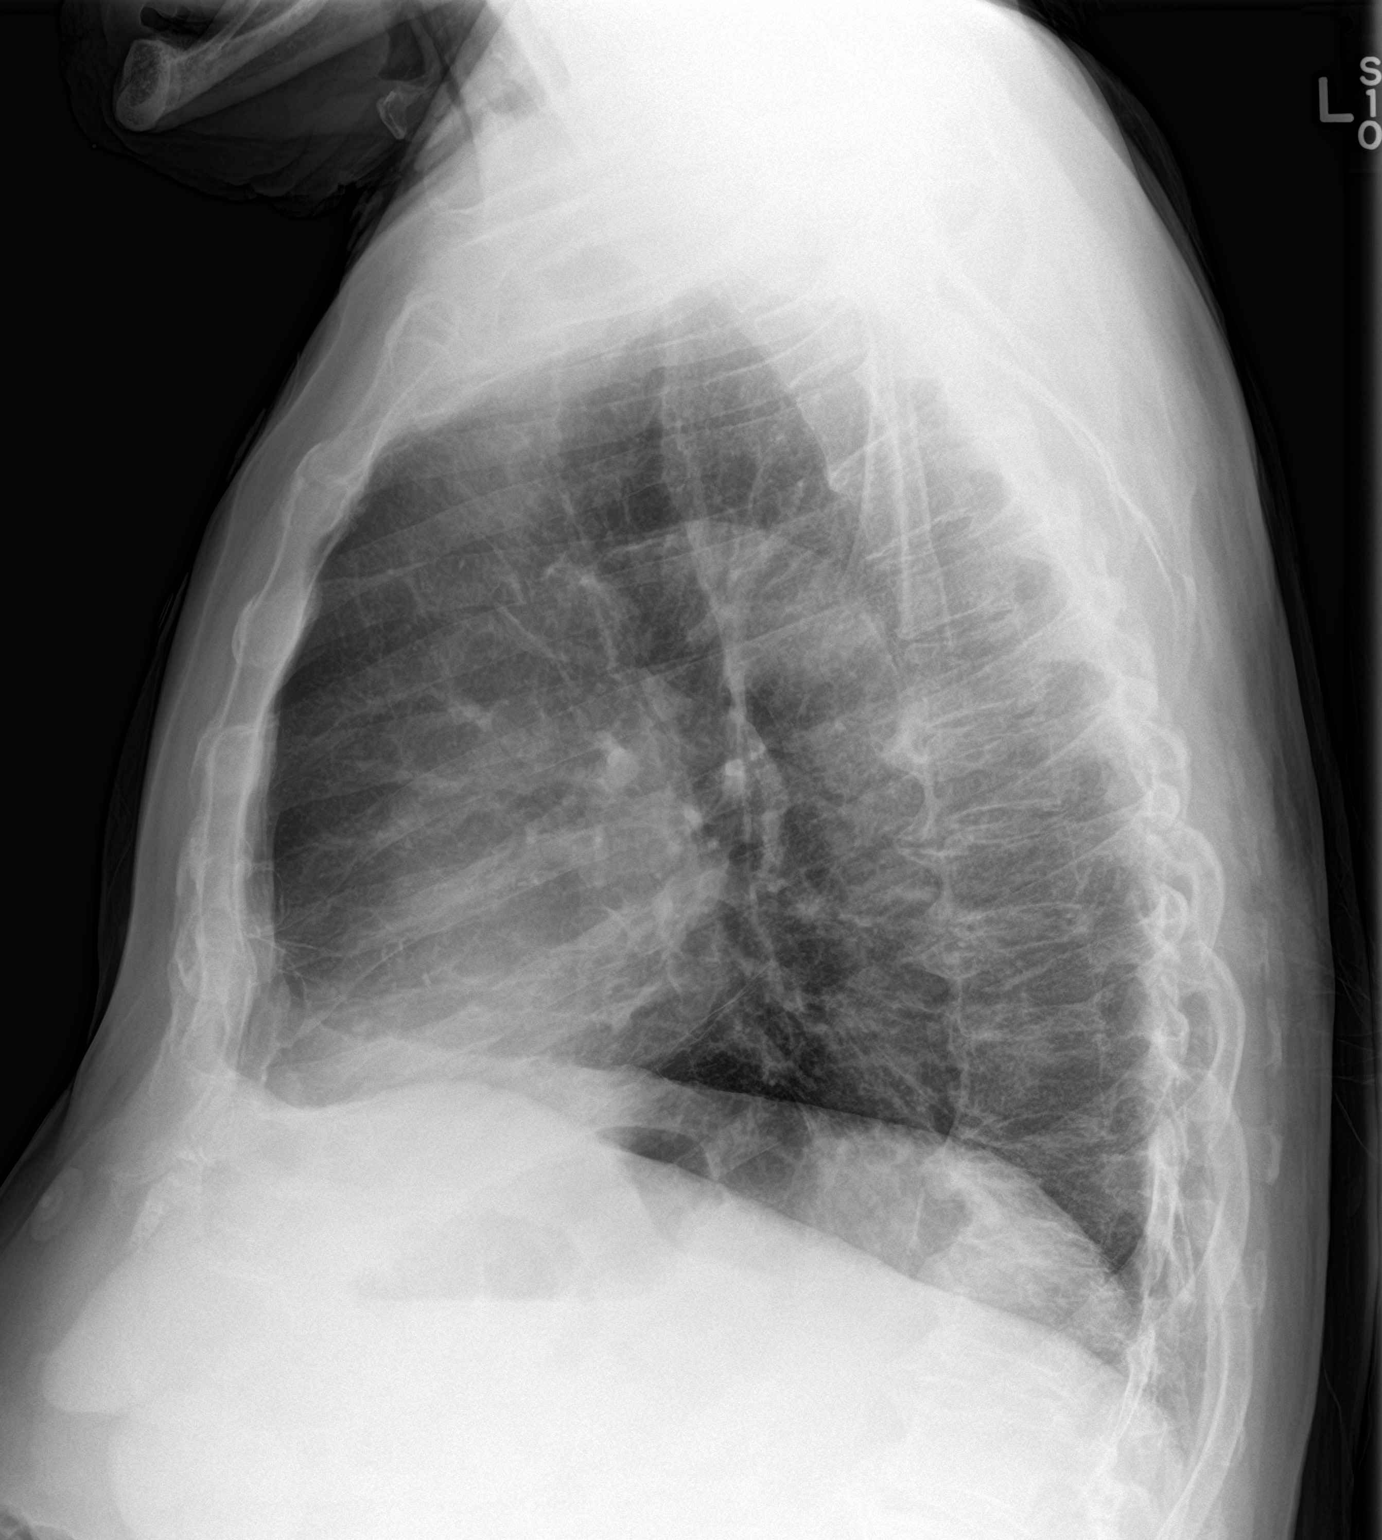

[2 of 2 positions shown; findings below may reference images not displayed]

FINDINGS: Stable cardiac silhouette within normal limits given projection and
technique. Aortic atherosclerosis with arch calcification. Linear
opacities at the left lung base probably represent scarring and/or
atelectasis. No focal consolidation. No pneumothorax or pleural
effusion. Multilevel degenerative changes of the thoracic spine.
IMPRESSION: No active cardiopulmonary disease.

By: Eneme Mokobe M.D.

## 2019-08-17 ENCOUNTER — Ambulatory Visit: Payer: Medicare Other | Attending: Internal Medicine

## 2019-08-17 DIAGNOSIS — Z23 Encounter for immunization: Secondary | ICD-10-CM | POA: Insufficient documentation

## 2019-08-17 NOTE — Progress Notes (Signed)
   Covid-19 Vaccination Clinic  Name:  Benjamin Sanders    MRN: [id number] DOB: [birth year]  08/17/2019  [name] was observed post Covid-19 immunization for 15 minutes without incident. He was provided with Vaccine Information Sheet and instruction to access the V-Safe system.   [name] was instructed to call 911 with any severe reactions post vaccine: Marland Kitchen Difficulty breathing  . Swelling of face and throat  . A fast heartbeat  . A bad rash all over body  . Dizziness and weakness   Immunizations Administered    Name Date Dose VIS Date Route   Pfizer COVID-19 Vaccine 08/17/2019  3:53 PM 0.3 mL 05/26/2019 Intramuscular   Manufacturer: [employer], [employer]   Lot: [id number]   NDC: [address]-9

## 2019-09-13 ENCOUNTER — Ambulatory Visit: Payer: Medicare Other | Attending: Internal Medicine

## 2019-09-13 DIAGNOSIS — Z23 Encounter for immunization: Secondary | ICD-10-CM

## 2019-09-13 NOTE — Progress Notes (Signed)
   Covid-19 Vaccination Clinic  Name:  Benjamin Sanders    MRN: [id number] DOB: 02-Jul-1927  09/13/2019  Mr. Umland was observed post Covid-19 immunization for 15 minutes without incident. He was provided with Vaccine Information Sheet and instruction to access the V-Safe system.   Mr. Eliot was instructed to call 911 with any severe reactions post vaccine: Marland Kitchen Difficulty breathing  . Swelling of face and throat  . A fast heartbeat  . A bad rash all over body  . Dizziness and weakness   Immunizations Administered    Name Date Dose VIS Date Route   Pfizer COVID-19 Vaccine 09/13/2019 10:13 AM 0.3 mL 05/26/2019 Intramuscular   Manufacturer: [employer], [employer]   Lot: [id number]   NDC: [address]-3

## 2022-11-20 ENCOUNTER — Other Ambulatory Visit: Payer: Self-pay

## 2022-11-20 ENCOUNTER — Emergency Department (HOSPITAL_COMMUNITY): Payer: Medicare Other

## 2022-11-20 ENCOUNTER — Observation Stay (HOSPITAL_COMMUNITY)
Admission: EM | Admit: 2022-11-20 | Discharge: 2022-11-21 | Disposition: A | Discharge status: Home / Self Care | Payer: Medicare Other | Attending: Internal Medicine | Admitting: Internal Medicine

## 2022-11-20 DIAGNOSIS — I10 Essential (primary) hypertension: Secondary | ICD-10-CM | POA: Diagnosis present

## 2022-11-20 DIAGNOSIS — Z7982 Long term (current) use of aspirin: Secondary | ICD-10-CM | POA: Diagnosis not present

## 2022-11-20 DIAGNOSIS — Z79899 Other long term (current) drug therapy: Secondary | ICD-10-CM | POA: Diagnosis not present

## 2022-11-20 DIAGNOSIS — J45909 Unspecified asthma, uncomplicated: Secondary | ICD-10-CM | POA: Insufficient documentation

## 2022-11-20 DIAGNOSIS — Z87891 Personal history of nicotine dependence: Secondary | ICD-10-CM | POA: Diagnosis not present

## 2022-11-20 DIAGNOSIS — L03119 Cellulitis of unspecified part of limb: Secondary | ICD-10-CM | POA: Diagnosis present

## 2022-11-20 DIAGNOSIS — Z23 Encounter for immunization: Secondary | ICD-10-CM | POA: Insufficient documentation

## 2022-11-20 DIAGNOSIS — L03116 Cellulitis of left lower limb: Principal | ICD-10-CM | POA: Insufficient documentation

## 2022-11-20 DIAGNOSIS — Z85828 Personal history of other malignant neoplasm of skin: Secondary | ICD-10-CM | POA: Diagnosis not present

## 2022-11-20 DIAGNOSIS — E785 Hyperlipidemia, unspecified: Secondary | ICD-10-CM | POA: Diagnosis present

## 2022-11-20 DIAGNOSIS — R600 Localized edema: Secondary | ICD-10-CM | POA: Diagnosis present

## 2022-11-20 DIAGNOSIS — N1832 Chronic kidney disease, stage 3b: Secondary | ICD-10-CM | POA: Diagnosis not present

## 2022-11-20 DIAGNOSIS — I129 Hypertensive chronic kidney disease with stage 1 through stage 4 chronic kidney disease, or unspecified chronic kidney disease: Secondary | ICD-10-CM | POA: Insufficient documentation

## 2022-11-20 LAB — URINALYSIS, ROUTINE W REFLEX MICROSCOPIC
Bacteria, UA: NONE SEEN
Bilirubin Urine: NEGATIVE
Glucose, UA: NEGATIVE mg/dL
Ketones, ur: NEGATIVE mg/dL
Leukocytes,Ua: NEGATIVE
Nitrite: NEGATIVE
Protein, ur: 30 mg/dL — AB
Specific Gravity, Urine: 1.011 (ref 1.005–1.030)
pH: 5 (ref 5.0–8.0)

## 2022-11-20 LAB — COMPREHENSIVE METABOLIC PANEL
ALT: 18 U/L (ref 0–44)
AST: 22 U/L (ref 15–41)
Albumin: 4.1 g/dL (ref 3.5–5.0)
Alkaline Phosphatase: 62 U/L (ref 38–126)
Anion gap: 13 (ref 5–15)
BUN: 35 mg/dL — ABNORMAL HIGH (ref 8–23)
CO2: 25 mmol/L (ref 22–32)
Calcium: 9.4 mg/dL (ref 8.9–10.3)
Chloride: 98 mmol/L (ref 98–111)
Creatinine, Ser: 2.02 mg/dL — ABNORMAL HIGH (ref 0.61–1.24)
GFR, Estimated: 30 mL/min — ABNORMAL LOW (ref >60)
Glucose, Bld: 123 mg/dL — ABNORMAL HIGH (ref 70–99)
Potassium: 3.3 mmol/L — ABNORMAL LOW (ref 3.5–5.1)
Sodium: 136 mmol/L (ref 135–145)
Total Bilirubin: 1.3 mg/dL — ABNORMAL HIGH (ref 0.3–1.2)
Total Protein: 7.5 g/dL (ref 6.5–8.1)

## 2022-11-20 LAB — CBC WITH DIFFERENTIAL/PLATELET
Abs Immature Granulocytes: 0.05 10*3/uL (ref 0.00–0.07)
Basophils Absolute: 0 10*3/uL (ref 0.0–0.1)
Basophils Relative: 0 %
Eosinophils Absolute: 0 10*3/uL (ref 0.0–0.5)
Eosinophils Relative: 0 %
HCT: 40.2 % (ref 39.0–52.0)
Hemoglobin: 13.4 g/dL (ref 13.0–17.0)
Immature Granulocytes: 0 %
Lymphocytes Relative: 8 %
Lymphs Abs: 1 10*3/uL (ref 0.7–4.0)
MCH: 30.3 pg (ref 26.0–34.0)
MCHC: 33.3 g/dL (ref 30.0–36.0)
MCV: 91 fL (ref 80.0–100.0)
Monocytes Absolute: 0.8 10*3/uL (ref 0.1–1.0)
Monocytes Relative: 6 %
Neutro Abs: 11.4 10*3/uL — ABNORMAL HIGH (ref 1.7–7.7)
Neutrophils Relative %: 86 %
Platelets: 213 10*3/uL (ref 150–400)
RBC: 4.42 MIL/uL (ref 4.22–5.81)
RDW: 13.8 % (ref 11.5–15.5)
WBC: 13.2 10*3/uL — ABNORMAL HIGH (ref 4.0–10.5)
nRBC: 0 % (ref 0.0–0.2)

## 2022-11-20 LAB — LACTIC ACID, PLASMA: Lactic Acid, Venous: 1.4 mmol/L (ref 0.5–1.9)

## 2022-11-20 LAB — CULTURE, BLOOD (ROUTINE X 2)

## 2022-11-20 MED ORDER — HEPARIN SODIUM (PORCINE) 5000 UNIT/ML IJ SOLN
5000.0000 [IU] | Freq: Three times a day (TID) | INTRAMUSCULAR | Status: DC
  Administered 2022-11-20 – 2022-11-21 (×2): 5000 [IU] via SUBCUTANEOUS
  Filled 2022-11-20 (×2): qty 1

## 2022-11-20 MED ORDER — VANCOMYCIN HCL IN DEXTROSE 1-5 GM/200ML-% IV SOLN
1000.0000 mg | Freq: Once | INTRAVENOUS | Status: AC
  Administered 2022-11-20: 1000 mg via INTRAVENOUS
  Filled 2022-11-20: qty 200

## 2022-11-20 MED ORDER — SENNA 8.6 MG PO TABS
1.0000 | ORAL_TABLET | Freq: Two times a day (BID) | ORAL | Status: DC
  Administered 2022-11-20 – 2022-11-21 (×2): 8.6 mg via ORAL
  Filled 2022-11-20 (×2): qty 1

## 2022-11-20 MED ORDER — ASPIRIN 81 MG PO TBEC
81.0000 mg | DELAYED_RELEASE_TABLET | Freq: Every day | ORAL | Status: DC
  Administered 2022-11-21: 81 mg via ORAL
  Filled 2022-11-20: qty 1

## 2022-11-20 MED ORDER — POLYETHYLENE GLYCOL 3350 17 G PO PACK: 17.0000 g | PACK | Freq: Every day | ORAL | Status: DC | PRN

## 2022-11-20 MED ORDER — ACETAMINOPHEN 325 MG PO TABS: 650.0000 mg | ORAL_TABLET | Freq: Four times a day (QID) | ORAL | Status: DC | PRN

## 2022-11-20 MED ORDER — ACETAMINOPHEN 650 MG RE SUPP: 650.0000 mg | Freq: Four times a day (QID) | RECTAL | Status: DC | PRN

## 2022-11-20 MED ORDER — ALLOPURINOL 100 MG PO TABS
100.0000 mg | ORAL_TABLET | Freq: Every day | ORAL | Status: DC
  Administered 2022-11-21: 100 mg via ORAL
  Filled 2022-11-20: qty 1

## 2022-11-20 MED ORDER — VANCOMYCIN HCL 750 MG/150ML IV SOLN
750.0000 mg | INTRAVENOUS | Status: DC
  Filled 2022-11-20: qty 150

## 2022-11-20 MED ORDER — TETANUS-DIPHTH-ACELL PERTUSSIS 5-2.5-18.5 LF-MCG/0.5 IM SUSY
PREFILLED_SYRINGE | INTRAMUSCULAR | Status: AC
  Filled 2022-11-20: qty 0.5

## 2022-11-20 MED ORDER — OXYCODONE HCL 5 MG PO TABS: 5.0000 mg | ORAL_TABLET | ORAL | Status: DC | PRN

## 2022-11-20 MED ORDER — SODIUM CHLORIDE 0.9 % IV SOLN: 2.0000 g | INTRAVENOUS | Status: DC

## 2022-11-20 MED ORDER — HYDRALAZINE HCL 20 MG/ML IJ SOLN: 10.0000 mg | Freq: Four times a day (QID) | INTRAMUSCULAR | Status: DC | PRN

## 2022-11-20 MED ORDER — ALBUTEROL SULFATE (2.5 MG/3ML) 0.083% IN NEBU
2.5000 mg | INHALATION_SOLUTION | Freq: Four times a day (QID) | RESPIRATORY_TRACT | Status: DC
  Administered 2022-11-20: 2.5 mg via RESPIRATORY_TRACT
  Filled 2022-11-20: qty 3

## 2022-11-20 MED ORDER — ALBUTEROL SULFATE (2.5 MG/3ML) 0.083% IN NEBU
2.5000 mg | INHALATION_SOLUTION | Freq: Three times a day (TID) | RESPIRATORY_TRACT | Status: DC
  Administered 2022-11-21: 2.5 mg via RESPIRATORY_TRACT
  Filled 2022-11-20: qty 3

## 2022-11-20 MED ORDER — CEFEPIME HCL 2 G IV SOLR
2.0000 g | Freq: Once | INTRAVENOUS | Status: AC
  Administered 2022-11-20: 2 g via INTRAVENOUS
  Filled 2022-11-20: qty 12.5

## 2022-11-20 MED ORDER — AMLODIPINE BESYLATE 5 MG PO TABS
5.0000 mg | ORAL_TABLET | Freq: Every day | ORAL | Status: DC
  Administered 2022-11-21: 5 mg via ORAL
  Filled 2022-11-20: qty 1

## 2022-11-20 MED ORDER — TETANUS-DIPHTH-ACELL PERTUSSIS 5-2.5-18.5 LF-MCG/0.5 IM SUSY
0.5000 mL | PREFILLED_SYRINGE | Freq: Once | INTRAMUSCULAR | Status: AC
  Administered 2022-11-20: 0.5 mL via INTRAMUSCULAR
  Filled 2022-11-20: qty 0.5

## 2022-11-20 MED ORDER — ROSUVASTATIN CALCIUM 10 MG PO TABS
10.0000 mg | ORAL_TABLET | Freq: Every day | ORAL | Status: DC
  Administered 2022-11-21: 10 mg via ORAL
  Filled 2022-11-20: qty 0.5
  Filled 2022-11-20: qty 1

## 2022-11-20 MED ORDER — LACTATED RINGERS IV SOLN: INTRAVENOUS | Status: DC

## 2022-11-20 NOTE — ED Triage Notes (Signed)
Pt arrived via POV. Pt injured L calf in garden. Spreading redness and swelling noted in leg.  AOx4

## 2022-11-20 NOTE — H&P (Addendum)
[facility] History and Physical  Benjamin Sanders KGM:[id number] DOB: 11/13/1927 DOA: 11/20/2022 PCP: Rodrigo Ran, MD  Admitted from: Home Chief Complaint: Worsening left leg cellulitis  History of Present Illness: Benjamin Sanders is a 89 y.o. male with PMH significant for HTN, HLD, CKD, BPH, gout, asthma. Patient presented to the ED with complaint of worsening left leg cellulitis. 2 days ago, he was outside gardening.  He hit the back of his left leg on a plant pot.  He started with a little bit of redness but over the next 24 hours, the area became red swollen and tender.  It popped up and drained small amount of pus.  The surrounding area of redness, pain continued to worsen and hence he came to the ED.  No fever.  In the ED, patient was afebrile, hemodynamically able, breathing on room air On exam, he was noted to have a 2.5 cm sallow puncture wound on left calf with some drainage and a blackish eschar.  He had a wide area edema involving the whole left calf and extending anteriorly.  Pictures taken. Labs showed WBC count 13.2, lactic acid 1.4, BUN/creatinine 35/2.02 Urinalysis unremarkable except for small hemoglobin. X-ray left tibia-fibula showed soft tissue edema without soft tissue gas or radiopaque foreign body. No radiographic findings of osteomyelitis. Patient was started on IV cefepime, IV vancomycin and IV fluid Hospitalist service was consulted for inpatient admission and management  At the time of my evaluation, patient was lying down in bed.  Not in distress.  Looks remarkably healthy for his age. History reviewed with the patient and detailed as above.  Review of Systems:  All systems were reviewed and were negative unless otherwise mentioned in the HPI   Past medical history: Past Medical History:  Diagnosis Date   Asthma    BPH (benign prostatic hyperplasia)    CKD (chronic kidney disease)    stage 1   Gout    History of basal cell cancer    on back    Hypercholesteremia    Hypertension     Past surgical history: Past Surgical History:  Procedure Laterality Date   CATARACT EXTRACTION, BILATERAL     HERNIA REPAIR      Social History:  reports that he quit smoking about 56 years ago. His smoking use included cigarettes. He started smoking about 76 years ago. He has a 2.10 pack-year smoking history. He has quit using smokeless tobacco.  His smokeless tobacco use included chew. He reports that he does not drink alcohol and does not use drugs.  Allergies:  No Known Allergies Patient has no known allergies.   Family history:  Family History  Problem Relation Age of Onset   Stroke Mother    Heart attack Father    Cancer Sister        skin   Leukemia Brother    Lung disease Neg Hx      Home Meds: Prior to Admission medications   Medication Sig Start Date End Date Taking? Authorizing Provider  acetaminophen (TYLENOL) 500 MG tablet Take 1,000 mg by mouth every 6 (six) hours as needed for mild pain.   Yes [provider]  allopurinol (ZYLOPRIM) 100 MG tablet Take 100 mg by mouth daily.   Yes [provider]  amLODipine (NORVASC) 5 MG tablet Take 5 mg by mouth in the morning.   Yes [provider]  aspirin EC 81 MG tablet Take 81 mg by mouth daily. Reported on 06/20/2015   Yes  [provider]  furosemide (LASIX) 20 MG tablet Take 20 mg by mouth in the morning.   Yes [provider]  hydrochlorothiazide (MICROZIDE) 12.5 MG capsule Take 12.5 mg by mouth daily.   Yes [provider]  Potassium 99 MG TABS Take 198 mg by mouth daily with breakfast.   Yes [provider]  rosuvastatin (CRESTOR) 10 MG tablet Take 10 mg by mouth daily.   Yes [provider]  albuterol (PROVENTIL HFA;VENTOLIN HFA) 108 (90 Base) MCG/ACT inhaler Inhale 1-2 puffs into the lungs every 4 (four) hours as needed for wheezing or shortness of breath (cough). Patient not taking: Reported on 11/20/2022  01/17/16   Roslynn Amble, MD  predniSONE (DELTASONE) 20 MG tablet Take 2 tablets (40 mg total) by mouth daily. Patient not taking: Reported on 11/20/2022 04/08/16   Shon Baton, MD    Physical Exam: Vitals:   11/20/22 1442 11/20/22 1500 11/20/22 1830 11/20/22 1842  BP:  134/65 124/60   Pulse:  78 66   Resp:  20 18   Temp:    99.1 F (37.3 C)  TempSrc:    Oral  SpO2:  91% 94%   Weight: 83.9 kg     Height: 5\' 9"  (1.753 m)      Wt Readings from Last 3 Encounters:  11/20/22 83.9 kg  04/07/16 84.8 kg  03/02/16 90.7 kg   Body mass index is 27.32 kg/m.  General exam: Pleasant elderly Caucasian male.  Not in distress Skin: No rashes, lesions or ulcers. HEENT: Atraumatic, normocephalic, no obvious bleeding Lungs: Clear to auscultation bilaterally CVS: Regular rate and rhythm, no murmur GI/Abd soft, nontender, nondistended, bowel sound present CNS: Alert, awake, oriented x 3  Psychiatry: Mood appropriate Extremities: Wide area of redness and left calf with a small eschared punctate wound, no drainage.  Bilateral pedal edema left more than right.  Mild chronic stasis changes on both legs   ------------------------------------------------------------------------------------------------------ Assessment/Plan: Principal Problem:   Cellulitis of leg Active Problems:   Essential hypertension   Pedal edema   Hyperlipidemia  Left leg cellulitis Started out after a gardening injury and rapidly progressed in 2 days to involve the whole of the left calf Small punctate wound with moist drainage and eschar.  No active drainage noted. X-ray showed cellulitis changes without any foreign body or osteomyelitis No fever, WBC count elevated to 13.2 Blood culture sent. Started on IV cefepime and IV vancomycin in the ED Continue both for now. 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  Patient probably had progression of CKD over the years.  With ongoing infection, started on maintenance IV fluid and monitor Recent Labs    11/20/22 1624  BUN 35*  CREATININE 2.02*   Hypertension PTA on amlodipine 5 mg daily, HCTZ 12.5 mg daily Continue amlodipine.  Keep HCTZ on hold for now  HLD Aspirin and Crestor to continue  Gout Allopurinol  Mobility: Encourage ambulation.  Independent at baseline  Goals of care   Code Status: DNR.  Confirmed with patient   DVT prophylaxis:  heparin injection 5,000 Units Start: 11/20/22 2200   Antimicrobials: IV cefepime, IV vancomycin Fluid: LR at 125 mill per hour Consultants: None Family Communication: None at bedside  Dispo: The patient is from: Home  Anticipated d/c is to: Home hopefully in 2 to 3 days  Diet: Diet Order             Diet Heart Room service appropriate? Yes; Fluid consistency: Thin  Diet effective now                    ------------------------------------------------------------------------------------- Severity of Illness: The appropriate patient status for this patient is INPATIENT. Inpatient status is judged to be reasonable and necessary in order to provide the required intensity of service to ensure the patient's safety. The patient's presenting symptoms, physical exam findings, and initial radiographic and laboratory data in the context of their chronic comorbidities is felt to place them at high risk for further clinical deterioration. Furthermore, it is not anticipated that the patient will be medically stable for discharge from the hospital within 2 midnights of admission.   * I certify that at the point of admission it is my clinical judgment that the patient will require inpatient hospital care spanning beyond 2 midnights from the point of admission due to high intensity of service, high risk for further  deterioration and high frequency of surveillance required.* -------------------------------------------------------------------------------------   Labs on Admission:   CBC: Recent Labs  Lab 11/20/22 1624  WBC 13.2*  NEUTROABS 11.4*  HGB 13.4  HCT 40.2  MCV 91.0  PLT 213    Basic Metabolic Panel: Recent Labs  Lab 11/20/22 1624  NA 136  K 3.3*  CL 98  CO2 25  GLUCOSE 123*  BUN 35*  CREATININE 2.02*  CALCIUM 9.4    Liver Function Tests: Recent Labs  Lab 11/20/22 1624  AST 22  ALT 18  ALKPHOS 62  BILITOT 1.3*  PROT 7.5  ALBUMIN 4.1   No results for input(s): "LIPASE", "AMYLASE" in the last 168 hours. No results for input(s): "AMMONIA" in the last 168 hours.  Cardiac Enzymes: No results for input(s): "CKTOTAL", "CKMB", "CKMBINDEX", "TROPONINI" in the last 168 hours.  BNP (last 3 results) No results for input(s): "BNP" in the last 8760 hours.  ProBNP (last 3 results) No results for input(s): "PROBNP" in the last 8760 hours.  CBG: No results for input(s): "GLUCAP" in the last 168 hours.  Lipase  No results found for: "LIPASE"   Urinalysis    Component Value Date/Time   COLORURINE YELLOW 11/20/2022 1635   APPEARANCEUR CLEAR 11/20/2022 1635   LABSPEC 1.011 11/20/2022 1635   PHURINE 5.0 11/20/2022 1635   GLUCOSEU NEGATIVE 11/20/2022 1635   HGBUR SMALL (A) 11/20/2022 1635   BILIRUBINUR NEGATIVE 11/20/2022 1635   KETONESUR NEGATIVE 11/20/2022 1635   PROTEINUR 30 (A) 11/20/2022 1635   NITRITE NEGATIVE 11/20/2022 1635   LEUKOCYTESUR NEGATIVE 11/20/2022 1635     Drugs of Abuse  No results found for: "LABOPIA", "COCAINSCRNUR", "LABBENZ", "AMPHETMU", "THCU", "LABBARB"    Radiological Exams on Admission: DG Tibia/Fibula Left  Result Date: 11/20/2022 CLINICAL DATA:  Left lower extremity wound and cellulitis. EXAM: LEFT TIBIA AND FIBULA - 2 VIEW COMPARISON:  None Available. FINDINGS: Soft tissue edema. No soft tissue gas or radiopaque foreign body. No  fracture. No erosion or bony destructive change. Mild chronic degenerative change of the knee and ankle. IMPRESSION: Soft tissue edema without soft tissue gas or radiopaque foreign body. No radiographic findings of osteomyelitis. Electronically Signed   By: Narda Rutherford M.D.   On: 11/20/2022 17:41     Signed, Lorin Glass, MD [facility] 11/20/2022

## 2022-11-20 NOTE — ED Notes (Signed)
Had pt placed on oxygen via [place]

## 2022-11-20 NOTE — ED Provider Notes (Signed)
[place] EMERGENCY DEPARTMENT AT [facility] Provider Note   CSN: [id number] Arrival date & time: 11/20/22  1342     History {Add pertinent medical, surgical, social history, OB history to HPI:1} Chief Complaint  Patient presents with   Leg Injury   Wound Infection    Benjamin Sanders is a 89 y.o. male.  HPI Patient reports that 2 days ago he was outside gardening.  He hit the back of his left leg on a plant pot and had a wound on the back of his leg.  He reports that it started to get a little bit red but over the past day it got very red and swollen.  He reports it is painful.  He has a little bit of chills yesterday and today but no documented fever.  No nausea and vomiting.  No chest pain or shortness of breath.  Patient has history of mild asthma but has been doing well without dyspnea.  Reports he is due for tetanus update.    Home Medications Prior to Admission medications   Medication Sig Start Date End Date Taking? Authorizing Provider  acetaminophen (TYLENOL) 500 MG tablet Take 1,000 mg by mouth every 6 (six) hours as needed for mild pain.    [provider]  albuterol (PROVENTIL HFA;VENTOLIN HFA) 108 (90 Base) MCG/ACT inhaler Inhale 1-2 puffs into the lungs every 4 (four) hours as needed for wheezing or shortness of breath (cough). 01/17/16   Roslynn Amble, MD  allopurinol (ZYLOPRIM) 100 MG tablet Take 100 mg by mouth daily.    [provider]  amLODipine (NORVASC) 10 MG tablet Take 10 mg by mouth daily.    [provider]  aspirin EC 81 MG tablet Take 81 mg by mouth daily. Reported on 06/20/2015    [provider]  carvedilol (COREG) 12.5 MG tablet Take 12.5 mg by mouth 2 (two) times daily with a meal.    [provider]  Febuxostat (ULORIC) 80 MG TABS Take 40 mg by mouth daily.    [provider]  hydrochlorothiazide (MICROZIDE) 12.5 MG capsule Take 12.5 mg by mouth daily.    [provider]   Polyethyl Glycol-Propyl Glycol (SYSTANE FREE OP) Place 1 drop into both eyes daily as needed. For dry eyes    [provider]  predniSONE (DELTASONE) 20 MG tablet Take 2 tablets (40 mg total) by mouth daily. 04/08/16   Horton, Mayer Masker, MD  rosuvastatin (CRESTOR) 10 MG tablet Take 10 mg by mouth daily.    [provider]      Allergies    Patient has no known allergies.    Review of Systems   Review of Systems  Physical Exam Updated Vital Signs BP 134/65   Pulse 78   Temp 99.7 F (37.6 C) (Oral)   Resp 20   Ht 5\' 9"  (1.753 m)   Wt 83.9 kg   SpO2 91%   BMI 27.32 kg/m  Physical Exam Constitutional:      Comments: Patient is alert nontoxic.  Mental status clear.  Excellent condition for age.  No respiratory distress.  HENT:     Head: Normocephalic and atraumatic.     Mouth/Throat:     Pharynx: Oropharynx is clear.  Eyes:     Extraocular Movements: Extraocular movements intact.  Cardiovascular:     Rate and Rhythm: Normal rate and regular rhythm.     Comments: 2 out of 6 systolic ejection murmur. Pulmonary:  Comments: No respiratory distress.  Occasional basilar wheeze.  No crackle. Abdominal:     General: There is no distension.     Palpations: Abdomen is soft.     Tenderness: There is no abdominal tenderness. There is no guarding.  Musculoskeletal:     Comments: Patient has a 2 and half centimeter puncture wound that is shallow to the back of the left leg.  See attached image.  It has some moist drainage and eschar.  I do not appreciate fluctuance.  The leg has diffuse erythema that is confluent and dense with well the marcated borders.  Encompasses much of the lower leg.  The foot is spared.  This does not go beyond the knee.  See attached images.  Skin:    General: Skin is warm and dry.  Neurological:     General: No focal deficit present.     Mental Status: He is oriented to person, place, and time.     Motor: No weakness.     Coordination:  Coordination normal.     Comments: Patient is alert.  Speech is clear and situationally oriented.  He follows commands appropriately.  He can transition from lying down to sitting up at the edge of the stretcher.  No focal weakness.  Psychiatric:        Mood and Affect: Mood normal.        ED Results / Procedures / Treatments   Labs (all labs ordered are listed, but only abnormal results are displayed) Labs Reviewed  CULTURE, BLOOD (ROUTINE X 2)  CULTURE, BLOOD (ROUTINE X 2)  LACTIC ACID, PLASMA  LACTIC ACID, PLASMA  COMPREHENSIVE METABOLIC PANEL  CBC WITH DIFFERENTIAL/PLATELET  URINALYSIS, ROUTINE W REFLEX MICROSCOPIC    EKG None  Radiology No results found.  Procedures Procedures  {Document cardiac monitor, telemetry assessment procedure when appropriate:1}  Medications Ordered in ED Medications  vancomycin (VANCOCIN) IVPB 1000 mg/200 mL premix (has no administration in time range)  ceFEPIme (MAXIPIME) 2 g in sodium chloride 0.9 % 100 mL IVPB (has no administration in time range)  Tdap (BOOSTRIX) injection 0.5 mL (has no administration in time range)  lactated ringers infusion (has no administration in time range)    ED Course/ Medical Decision Making/ A&P   {   Click here for ABCD2, HEART and other calculatorsREFRESH Note before signing :1}                          Medical Decision Making Amount and/or Complexity of Data Reviewed Labs: ordered.   ***  {Document critical care time when appropriate:1} {Document review of labs and clinical decision tools ie heart score, Chads2Vasc2 etc:1}  {Document your independent review of radiology images, and any outside records:1} {Document your discussion with family members, caretakers, and with consultants:1} {Document social determinants of health affecting pt's care:1} {Document your decision making why or why not admission, treatments were needed:1} Final Clinical Impression(s) / ED Diagnoses Final diagnoses:   None    Rx / DC Orders ED Discharge Orders     None

## 2022-11-20 NOTE — Progress Notes (Signed)
Pharmacy Antibiotic Note  Bentzion Dauria is a 89 y.o. male admitted on 11/20/2022 with left leg cellulitis. Pharmacy has been consulted for Vancomycin and Cefepime dosing.  Plan: Vancomycin 1g IV x 1 given in the ED. Continue with Vancomycin 750mg  IV q24h.  Vancomycin levels at steady state, as indicated. Cefepime 2g IV q24h. Monitor renal function, cultures, clinical course.   Height: 5\' 9"  (175.3 cm) Weight: 83.9 kg (185 lb) IBW/kg (Calculated) : 70.7  Temp (24hrs), Avg:99.2 F (37.3 C), Min:98.7 F (37.1 C), Max:99.7 F (37.6 C)  Recent Labs  Lab 11/20/22 1624  WBC 13.2*  CREATININE 2.02*  LATICACIDVEN 1.4    Estimated Creatinine Clearance: 23.3 mL/min (A) (by C-G formula based on SCr of 2.02 mg/dL (H)).    No Known Allergies  Antimicrobials this admission: 6/7 Vancomycin >> 6/7 Cefepime >>  Dose adjustments this admission: --  Microbiology results: 6/7 BCx:    Thank you for allowing pharmacy to be a part of this patient's care.  Greer Pickerel, PharmD, BCPS Clinical Pharmacist 11/20/2022 8:36 PM

## 2022-11-20 NOTE — Plan of Care (Signed)

## 2022-11-21 ENCOUNTER — Encounter (HOSPITAL_COMMUNITY): Payer: Self-pay | Admitting: Internal Medicine

## 2022-11-21 DIAGNOSIS — L03116 Cellulitis of left lower limb: Secondary | ICD-10-CM | POA: Diagnosis not present

## 2022-11-21 LAB — CBC
HCT: 35.8 % — ABNORMAL LOW (ref 39.0–52.0)
Hemoglobin: 11.8 g/dL — ABNORMAL LOW (ref 13.0–17.0)
MCH: 30.4 pg (ref 26.0–34.0)
MCHC: 33 g/dL (ref 30.0–36.0)
MCV: 92.3 fL (ref 80.0–100.0)
Platelets: 187 10*3/uL (ref 150–400)
RBC: 3.88 MIL/uL — ABNORMAL LOW (ref 4.22–5.81)
RDW: 14.3 % (ref 11.5–15.5)
WBC: 8.9 10*3/uL (ref 4.0–10.5)
nRBC: 0 % (ref 0.0–0.2)

## 2022-11-21 LAB — BLOOD CULTURE ID PANEL (REFLEXED) - BCID2

## 2022-11-21 LAB — LACTIC ACID, PLASMA: Lactic Acid, Venous: 1.3 mmol/L (ref 0.5–1.9)

## 2022-11-21 LAB — BASIC METABOLIC PANEL
Anion gap: 10 (ref 5–15)
BUN: 34 mg/dL — ABNORMAL HIGH (ref 8–23)
CO2: 27 mmol/L (ref 22–32)
Calcium: 8.8 mg/dL — ABNORMAL LOW (ref 8.9–10.3)
Chloride: 99 mmol/L (ref 98–111)
Creatinine, Ser: 2.13 mg/dL — ABNORMAL HIGH (ref 0.61–1.24)
GFR, Estimated: 28 mL/min — ABNORMAL LOW (ref >60)
Glucose, Bld: 115 mg/dL — ABNORMAL HIGH (ref 70–99)
Potassium: 3.4 mmol/L — ABNORMAL LOW (ref 3.5–5.1)
Sodium: 136 mmol/L (ref 135–145)

## 2022-11-21 LAB — CULTURE, BLOOD (ROUTINE X 2)

## 2022-11-21 MED ORDER — POTASSIUM CHLORIDE CRYS ER 20 MEQ PO TBCR
40.0000 meq | EXTENDED_RELEASE_TABLET | Freq: Once | ORAL | Status: AC
  Administered 2022-11-21: 40 meq via ORAL
  Filled 2022-11-21: qty 2

## 2022-11-21 MED ORDER — ALBUTEROL SULFATE (2.5 MG/3ML) 0.083% IN NEBU: 2.5000 mg | INHALATION_SOLUTION | RESPIRATORY_TRACT | Status: DC | PRN

## 2022-11-21 MED ORDER — DOXYCYCLINE HYCLATE 100 MG PO TABS: 100.0000 mg | ORAL_TABLET | Freq: Two times a day (BID) | ORAL | 0 refills | Status: AC

## 2022-11-21 NOTE — Care Management CC44 (Signed)
Condition Code 44 Documentation Completed  Patient Details  Name: Benjamin Sanders MRN: [id number] Date of Birth: January 17, 1928   Condition Code 44 given:  Yes Patient signature on Condition Code 44 notice:  Yes Documentation of 2 MD's agreement:  Yes Code 44 added to claim:  Yes    Adrian Prows, RN 11/21/2022, 10:44 AM

## 2022-11-21 NOTE — TOC Initial Note (Signed)
Transition of Care [facility]) - Initial/Assessment Note    Patient Details  Name: Benjamin Sanders MRN: [id number] Date of Birth: 02-17-1928  Transition of Care [facility]) CM/SW Contact:    Adrian Prows, RN Phone Number: 11/21/2022, 10:51 AM  Clinical Narrative:                 Met w/ pt and dtrs in room; pt says he is from home and plans to return at d/c; he identified POC [facility]) [phone]; pt says he wears glasses and dentures (upper/lower(; pt says he has transportation; he denies IPV, food/housing insecurity, and difficulty paying utilities; pt says he has PCP and insurance; he also has cane, walker, BSC, shower chair, and grab bars; pt says he does not have HH services or home oxygen; no TOC needs.  Expected Discharge Plan: Home/Self Care Barriers to Discharge: No Barriers Identified   Patient Goals and CMS Choice Patient states their goals for this hospitalization and ongoing recovery are:: HOME   Choice offered to / list presented to : NA      Expected Discharge Plan and Services   Discharge Planning Services: CM Consult   Living arrangements for the past 2 months: Single Family Home Expected Discharge Date: 11/21/22               DME Arranged: N/A DME Agency: NA       HH Arranged: NA HH Agency: NA        Prior Living Arrangements/Services Living arrangements for the past 2 months: Single Family Home Lives with:: Adult Children Patient language and need for interpreter reviewed:: Yes Do you feel safe going back to the place where you live?: Yes      Need for Family Participation in Patient Care: Yes (Comment) Care giver support system in place?: Yes (comment) Current home services: DME (cane, walker, BSC, shower chair) Criminal Activity/Legal Involvement Pertinent to Current Situation/Hospitalization: No - Comment as needed  Activities of Daily Living Home Assistive Devices/Equipment: Cane (specify quad or straight) ADL Screening (condition at  time of admission) Patient's cognitive ability adequate to safely complete daily activities?: Yes Is the patient deaf or have difficulty hearing?: No Does the patient have difficulty seeing, even when wearing glasses/contacts?: No Does the patient have difficulty concentrating, remembering, or making decisions?: No Patient able to express need for assistance with ADLs?: Yes Does the patient have difficulty dressing or bathing?: No Independently performs ADLs?: Yes (appropriate for developmental age) Does the patient have difficulty walking or climbing stairs?: Yes Weakness of Legs: Both Weakness of Arms/Hands: None  Permission Sought/Granted Permission sought to share information with : Case Manager Permission granted to share information with : Yes, Verbal Permission Granted        Permission granted to share info w Relationship: [facility]umberto Leep [facility]) [phone]     Emotional Assessment Appearance:: Appears stated age Attitude/Demeanor/Rapport: Gracious Affect (typically observed): Accepting Orientation: : Oriented to Self, Oriented to Place, Oriented to  Time, Oriented to Situation Alcohol / Substance Use: Not Applicable Psych Involvement: No (comment)  Admission diagnosis:  Cellulitis of leg [L03.119] Cellulitis of left lower extremity [L03.116] Patient Active Problem List   Diagnosis Date Noted   Cellulitis of leg 11/20/2022   Asthma 01/17/2016   Hyperlipidemia 01/17/2016   Pedal edema 06/25/2015   Essential hypertension 06/20/2015   Gout 06/20/2015   CKD (chronic kidney disease), stage III (HCC) 06/20/2015   PCP:  Rodrigo Ran, MD Pharmacy:   [facility] -  [place], [place] - [address] [place] [address] Phone: [phone] Fax: [phone]  [facility] [address], [place] - [address] AT [address] [place] [address] Phone: [phone] Fax: [phone]     Social  Determinants of Health (SDOH) Social History: SDOH Screenings   Food Insecurity: No Food Insecurity (11/21/2022)  Housing: Patient Declined (11/21/2022)  Transportation Needs: No Transportation Needs (11/21/2022)  Utilities: Not At Risk (11/21/2022)  Tobacco Use: Medium Risk (11/21/2022)   SDOH Interventions: Food Insecurity Interventions: Inpatient TOC Housing Interventions: Inpatient TOC Transportation Interventions: Inpatient TOC Utilities Interventions: Inpatient TOC   Readmission Risk Interventions     No data to display

## 2022-11-21 NOTE — Care Management Obs Status (Signed)
MEDICARE OBSERVATION STATUS NOTIFICATION   Patient Details  Name: Benjamin Sanders MRN: [id number] Date of Birth: 1927-07-27   Medicare Observation Status Notification Given:  Yes    Adrian Prows, RN 11/21/2022, 10:44 AM

## 2022-11-21 NOTE — Discharge Summary (Signed)
Physician Discharge Summary  Benjamin Sanders ZOX:[id number] DOB: 16-Oct-1927 DOA: 11/20/2022  PCP: Benjamin Ran, MD  Admit date: 11/20/2022  Discharge date: 11/21/2022  Admitted From:Home  Disposition:  Home  Recommendations for Outpatient Follow-up:  Follow up with PCP in 1-2 weeks, follow-up BMP in 1 week to ensure creatinine levels are stable.  Recommend outpatient nephrology evaluation as needed. Continue on doxycycline twice daily as prescribed for 9 more days to complete total 10-day course of treatment for cellulitis Continue other home medications as prior  Home Health: None  Equipment/Devices: None  Discharge Condition:Stable  CODE STATUS: DNR  Diet recommendation: Heart Healthy  Brief/Interim Summary: Benjamin Sanders is a 89 y.o. male with PMH significant for HTN, HLD, CKD, BPH, gout, asthma. Patient presented to the ED with complaint of worsening left leg cellulitis.  He was started on IV cefepime and vancomycin empirically and had dramatic improvement in his cellulitis since admission.  His leukocytosis has resolved and he appears stable for discharge today.  He has stable renal function and likely CKD stage IIIb which would benefit from outpatient nephrology evaluation in the near future.  No other acute events or concerns noted.  Discharge Diagnoses:  Principal Problem:   Cellulitis of leg Active Problems:   Essential hypertension   Pedal edema   Hyperlipidemia  Principal discharge diagnosis: Left leg cellulitis secondary to small injury-improved.  CKD stage IIIb.  Discharge Instructions  Discharge Instructions     Diet - low sodium heart healthy   Complete by: As directed    Increase activity slowly   Complete by: As directed       Allergies as of 11/21/2022   No Known Allergies      Medication List     STOP taking these medications    predniSONE 20 MG tablet Commonly known as: DELTASONE       TAKE these medications    acetaminophen 500 MG  tablet Commonly known as: TYLENOL Take 1,000 mg by mouth every 6 (six) hours as needed for mild pain.   albuterol 108 (90 Base) MCG/ACT inhaler Commonly known as: VENTOLIN HFA Inhale 1-2 puffs into the lungs every 4 (four) hours as needed for wheezing or shortness of breath (cough).   allopurinol 100 MG tablet Commonly known as: ZYLOPRIM Take 100 mg by mouth daily.   amLODipine 5 MG tablet Commonly known as: NORVASC Take 5 mg by mouth in the morning.   aspirin EC 81 MG tablet Take 81 mg by mouth daily. Reported on 06/20/2015   doxycycline 100 MG tablet Commonly known as: VIBRA-TABS Take 1 tablet (100 mg total) by mouth 2 (two) times daily for 9 days.   furosemide 20 MG tablet Commonly known as: LASIX Take 20 mg by mouth in the morning.   hydrochlorothiazide 12.5 MG capsule Commonly known as: MICROZIDE Take 12.5 mg by mouth daily.   Potassium 99 MG Tabs Take 198 mg by mouth daily with breakfast.   rosuvastatin 10 MG tablet Commonly known as: CRESTOR Take 10 mg by mouth daily.        Follow-up Information     Benjamin Ran, MD. Schedule an appointment as soon as possible for a visit in 1 week(s).   Specialty: Internal Medicine Contact information: [address] [place] [address] [phone]                No Known Allergies  Consultations: None   Procedures/Studies: DG Tibia/Fibula Left  Result Date: 11/20/2022 CLINICAL DATA:  Left lower extremity  wound and cellulitis. EXAM: LEFT TIBIA AND FIBULA - 2 VIEW COMPARISON:  None Available. FINDINGS: Soft tissue edema. No soft tissue gas or radiopaque foreign body. No fracture. No erosion or bony destructive change. Mild chronic degenerative change of the knee and ankle. IMPRESSION: Soft tissue edema without soft tissue gas or radiopaque foreign body. No radiographic findings of osteomyelitis. Electronically Signed   By: Narda Rutherford M.D.   On: 11/20/2022 17:41     Discharge Exam: Vitals:    11/21/22 0749 11/21/22 0815  BP:  (!) 121/56  Pulse:  61  Resp:  20  Temp:  98.2 F (36.8 C)  SpO2: 96% 96%   Vitals:   11/21/22 0023 11/21/22 0422 11/21/22 0749 11/21/22 0815  BP: (!) 137/52 (!) 119/59  (!) 121/56  Pulse: 66 72  61  Resp: 16 16  20   Temp: 99 F (37.2 C) 98.6 F (37 C)  98.2 F (36.8 C)  TempSrc: Oral   Oral  SpO2: 94% 91% 96% 96%  Weight:      Height:        General: Pt is alert, awake, not in acute distress Cardiovascular: RRR, S1/S2 +, no rubs, no gallops Respiratory: CTA bilaterally, no wheezing, no rhonchi Abdominal: Soft, NT, ND, bowel sounds + Extremities: Left lower extremity erythema improved.    The results of significant diagnostics from this hospitalization (including imaging, microbiology, ancillary and laboratory) are listed below for reference.     Microbiology: Recent Results (from the past 240 hour(s))  Culture, blood (routine x 2)     Status: None (Preliminary result)   Collection Time: 11/20/22  4:24 PM   Specimen: BLOOD  Result Value Ref Range Status   Specimen Description   Final    BLOOD SITE NOT SPECIFIED Performed at [facility], [address], [place], [place] [address]    Special Requests   Final    BOTTLES DRAWN AEROBIC AND ANAEROBIC Blood Culture adequate volume Performed at [facility], [address] [place], [place] [address]    Culture   Final    NO GROWTH < 12 HOURS Performed at [facility], [address], [place], [place] [address]    Report Status PENDING  Incomplete     Labs: BNP (last 3 results) No results for input(s): "BNP" in the last 8760 hours. Basic Metabolic Panel: Recent Labs  Lab 11/20/22 1624 11/21/22 0629  NA 136 136  K 3.3* 3.4*  CL 98 99  CO2 25 27  GLUCOSE 123* 115*  BUN 35* 34*  CREATININE 2.02* 2.13*  CALCIUM 9.4 8.8*   Liver Function Tests: Recent Labs  Lab 11/20/22 1624  AST 22  ALT 18  ALKPHOS 62  BILITOT 1.3*  PROT 7.5  ALBUMIN  4.1   No results for input(s): "LIPASE", "AMYLASE" in the last 168 hours. No results for input(s): "AMMONIA" in the last 168 hours. CBC: Recent Labs  Lab 11/20/22 1624 11/21/22 0629  WBC 13.2* 8.9  NEUTROABS 11.4*  --   HGB 13.4 11.8*  HCT 40.2 35.8*  MCV 91.0 92.3  PLT 213 187   Cardiac Enzymes: No results for input(s): "CKTOTAL", "CKMB", "CKMBINDEX", "TROPONINI" in the last 168 hours. BNP: Invalid input(s): "POCBNP" CBG: No results for input(s): "GLUCAP" in the last 168 hours. D-Dimer No results for input(s): "DDIMER" in the last 72 hours. Hgb A1c No results for input(s): "HGBA1C" in the last 72 hours. Lipid Profile No results for input(s): "CHOL", "  HDL", "LDLCALC", "TRIG", "CHOLHDL", "LDLDIRECT" in the last 72 hours. Thyroid function studies No results for input(s): "TSH", "T4TOTAL", "T3FREE", "THYROIDAB" in the last 72 hours.  Invalid input(s): "FREET3" Anemia work up No results for input(s): "VITAMINB12", "FOLATE", "FERRITIN", "TIBC", "IRON", "RETICCTPCT" in the last 72 hours. Urinalysis    Component Value Date/Time   COLORURINE YELLOW 11/20/2022 1635   APPEARANCEUR CLEAR 11/20/2022 1635   LABSPEC 1.011 11/20/2022 1635   PHURINE 5.0 11/20/2022 1635   GLUCOSEU NEGATIVE 11/20/2022 1635   HGBUR SMALL (A) 11/20/2022 1635   BILIRUBINUR NEGATIVE 11/20/2022 1635   KETONESUR NEGATIVE 11/20/2022 1635   PROTEINUR 30 (A) 11/20/2022 1635   NITRITE NEGATIVE 11/20/2022 1635   LEUKOCYTESUR NEGATIVE 11/20/2022 1635   Sepsis Labs Recent Labs  Lab 11/20/22 1624 11/21/22 0629  WBC 13.2* 8.9   Microbiology Recent Results (from the past 240 hour(s))  Culture, blood (routine x 2)     Status: None (Preliminary result)   Collection Time: 11/20/22  4:24 PM   Specimen: BLOOD  Result Value Ref Range Status   Specimen Description   Final    BLOOD SITE NOT SPECIFIED Performed at [facility], [address], [place], [place] [address]    Special Requests   Final     BOTTLES DRAWN AEROBIC AND ANAEROBIC Blood Culture adequate volume Performed at [facility], [address], [place], [place] [address]    Culture   Final    NO GROWTH < 12 HOURS Performed at [facility], [address] [place], [place] [address]    Report Status PENDING  Incomplete     Time coordinating discharge: 35 minutes  SIGNED:   Erick Blinks, DO [facility] 11/21/2022, 9:58 AM  If 7PM-7AM, please contact night-coverage [email or web address]

## 2022-11-22 LAB — CULTURE, BLOOD (ROUTINE X 2)
Special Requests: ADEQUATE
Special Requests: ADEQUATE

## 2022-11-24 LAB — CULTURE, BLOOD (ROUTINE X 2): Culture: NO GROWTH
